# Patient Record
Sex: Female | Born: 2000 | Race: Black or African American | Hispanic: No | Marital: Single | Smoking: Never smoker
Health system: Southern US, Community
[De-identification: ages and names within clinical notes are randomized; demographics above are authoritative.]

## PROBLEM LIST (undated history)

## (undated) ENCOUNTER — Inpatient Hospital Stay: Payer: Self-pay

## (undated) DIAGNOSIS — Z91410 Personal history of adult physical and sexual abuse: Secondary | ICD-10-CM

## (undated) DIAGNOSIS — Z789 Other specified health status: Secondary | ICD-10-CM

## (undated) DIAGNOSIS — I10 Essential (primary) hypertension: Secondary | ICD-10-CM

## (undated) HISTORY — PX: NO PAST SURGERIES: SHX2092

## (undated) HISTORY — DX: Personal history of adult physical and sexual abuse: Z91.410

## (undated) HISTORY — DX: Essential (primary) hypertension: I10

---
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
## 2019-01-16 ENCOUNTER — Encounter (HOSPITAL_COMMUNITY): Payer: Self-pay

## 2019-04-10 DIAGNOSIS — E663 Overweight: Secondary | ICD-10-CM

## 2019-04-10 DIAGNOSIS — I10 Essential (primary) hypertension: Secondary | ICD-10-CM

## 2019-04-13 ENCOUNTER — Ambulatory Visit: Payer: Medicaid Other

## 2019-04-23 ENCOUNTER — Ambulatory Visit: Payer: Medicaid Other

## 2019-05-01 ENCOUNTER — Ambulatory Visit: Payer: Medicaid Other

## 2019-05-08 ENCOUNTER — Other Ambulatory Visit: Payer: Self-pay

## 2019-05-08 ENCOUNTER — Ambulatory Visit: Payer: Medicaid Other | Admitting: Physician Assistant

## 2019-05-08 ENCOUNTER — Encounter: Payer: Self-pay | Admitting: Physician Assistant

## 2019-05-08 DIAGNOSIS — B9689 Other specified bacterial agents as the cause of diseases classified elsewhere: Secondary | ICD-10-CM

## 2019-05-08 DIAGNOSIS — N760 Acute vaginitis: Secondary | ICD-10-CM

## 2019-05-08 DIAGNOSIS — Z113 Encounter for screening for infections with a predominantly sexual mode of transmission: Secondary | ICD-10-CM | POA: Diagnosis not present

## 2019-05-08 MED ORDER — METRONIDAZOLE 500 MG PO TABS: 500.0000 mg | ORAL_TABLET | Freq: Two times a day (BID) | ORAL | 0 refills | Status: DC

## 2019-05-08 NOTE — Progress Notes (Signed)
STI clinic/screening visit  Subjective:  Brittany Rasmussen is a 18 y.o. female being seen today for an STI screening visit. The patient reports they do have symptoms.  Patient has the following medical conditions:   Patient Active Problem List   Diagnosis Date Noted  . Chronic hypertension 12/09/2018  . Overweight 12/09/2018     Chief Complaint  Patient presents with  . SEXUALLY TRANSMITTED DISEASE    HPI  Patient reports that she has had vaginal irritation and odor for 2 weeks.  Reports that she last had sex 6 months or more ago.  LMP 05/01/2019 and normal.  Declines blood work and GC/Chlamydia testing and requests wet mount only today.   See flowsheet for further details and programmatic requirements.    The following portions of the patient's history were reviewed and updated as appropriate: allergies, current medications, past medical history, past social history, past surgical history and problem list.  Objective:  There were no vitals filed for this visit.  Physical Exam Constitutional:      General: She is not in acute distress.    Appearance: Normal appearance.  HENT:     Head: Normocephalic and atraumatic.     Mouth/Throat:     Mouth: Mucous membranes are moist.     Pharynx: Oropharynx is clear. No oropharyngeal exudate.  Eyes:     Conjunctiva/sclera: Conjunctivae normal.  Neck:     Musculoskeletal: Neck supple.  Pulmonary:     Effort: Pulmonary effort is normal.  Abdominal:     Palpations: Abdomen is soft. There is no mass.     Tenderness: There is no abdominal tenderness. There is no guarding or rebound.  Genitourinary:    General: Normal vulva.     Rectum: Normal.     Comments: External genitalia/pubic area without nits, lice, edema, erythema, lesions and inguinal adenopathy. Vagina with normal mucosa and small amount of thin, grayish, adherent discharge, pH= >4.5. Cervix without visible lesions. Uterus normal size, firm, mobile, nt, no masses, no  CMT, no adnexal tenderness.  Lymphadenopathy:     Cervical: No cervical adenopathy.  Skin:    General: Skin is warm.     Findings: No bruising, erythema, lesion or rash.  Neurological:     Mental Status: She is alert and oriented to person, place, and time.  Psychiatric:        Mood and Affect: Mood normal.        Behavior: Behavior normal.        Thought Content: Thought content normal.        Judgment: Judgment normal.       Assessment and Plan:  Brittany Rasmussen is a 18 y.o. female presenting to the [facility] Department for STI screening  1. Screening for STD (sexually transmitted disease) Patient with symptoms for 2 weeks. Rec condoms with all sex - WET PREP FOR TRICH, YEAST, CLUE  2. BV (bacterial vaginosis) Will treat for BV due to s/s and wet mount results. Metronidazole 500mg  #14 1 po BID for 7 days with food, no EtOH for 24 hr before and until 72 hr after completing medicine. Rec to use OTC antifungal cream if itching or increased discharge during or just after treatment with antibiotic.  - metroNIDAZOLE (FLAGYL) 500 MG tablet; Take 1 tablet (500 mg total) by mouth 2 (two) times daily.  Dispense: 14 tablet; Refill: 0     No follow-ups on file.  No future appointments.  Jerene Dilling, PA

## 2019-05-11 LAB — WET PREP FOR TRICH, YEAST, CLUE
Trichomonas Exam: NEGATIVE
Yeast Exam: NEGATIVE

## 2019-09-11 NOTE — L&D Delivery Note (Signed)
Delivery Note At 12:37 PM a viable female was delivered via Vaginal, Spontaneous (Presentation:   Occiput Anterior).  APGAR: 8, 9; weight 7 lb 8.3 oz (3410 g).   Placenta status: Spontaneous, Intact.  Cord: 3 vessels with the following complications: None.  Cord pH: NA  Anesthesia: Epidural Episiotomy: None Lacerations: None Suture Repair: 3.0 Est. Blood Loss (mL): 50  Mom to postpartum.  Baby to Couplet care / Skin to Skin.  Charlesetta Garibaldi Traci Plemons 09/09/2020, 2:27 PM

## 2019-09-25 ENCOUNTER — Other Ambulatory Visit: Payer: Medicaid Other

## 2020-01-21 ENCOUNTER — Encounter (HOSPITAL_COMMUNITY): Payer: Self-pay | Admitting: Pediatrics

## 2020-01-21 ENCOUNTER — Emergency Department (HOSPITAL_COMMUNITY): Payer: Medicaid Other

## 2020-01-21 ENCOUNTER — Other Ambulatory Visit: Payer: Self-pay

## 2020-01-21 ENCOUNTER — Emergency Department (HOSPITAL_COMMUNITY)
Admission: EM | Admit: 2020-01-21 | Discharge: 2020-01-21 | Disposition: A | Discharge status: Home / Self Care | Payer: Medicaid Other | Attending: Emergency Medicine | Admitting: Emergency Medicine

## 2020-01-21 DIAGNOSIS — F17200 Nicotine dependence, unspecified, uncomplicated: Secondary | ICD-10-CM | POA: Diagnosis not present

## 2020-01-21 DIAGNOSIS — I10 Essential (primary) hypertension: Secondary | ICD-10-CM | POA: Insufficient documentation

## 2020-01-21 DIAGNOSIS — Z20822 Contact with and (suspected) exposure to covid-19: Secondary | ICD-10-CM | POA: Insufficient documentation

## 2020-01-21 DIAGNOSIS — J029 Acute pharyngitis, unspecified: Secondary | ICD-10-CM | POA: Insufficient documentation

## 2020-01-21 DIAGNOSIS — J039 Acute tonsillitis, unspecified: Secondary | ICD-10-CM | POA: Insufficient documentation

## 2020-01-21 LAB — CBC WITH DIFFERENTIAL/PLATELET
Abs Immature Granulocytes: 0.1 10*3/uL — ABNORMAL HIGH (ref 0.00–0.07)
Basophils Absolute: 0 10*3/uL (ref 0.0–0.1)
Basophils Relative: 0 %
Eosinophils Absolute: 0.1 10*3/uL (ref 0.0–0.5)
Eosinophils Relative: 0 %
HCT: 35 % — ABNORMAL LOW (ref 36.0–46.0)
Hemoglobin: 11 g/dL — ABNORMAL LOW (ref 12.0–15.0)
Immature Granulocytes: 1 %
Lymphocytes Relative: 6 %
Lymphs Abs: 1 10*3/uL (ref 0.7–4.0)
MCH: 27.2 pg (ref 26.0–34.0)
MCHC: 31.4 g/dL (ref 30.0–36.0)
MCV: 86.6 fL (ref 80.0–100.0)
Monocytes Absolute: 0.8 10*3/uL (ref 0.1–1.0)
Monocytes Relative: 5 %
Neutro Abs: 15.1 10*3/uL — ABNORMAL HIGH (ref 1.7–7.7)
Neutrophils Relative %: 88 %
Platelets: 227 10*3/uL (ref 150–400)
RBC: 4.04 MIL/uL (ref 3.87–5.11)
RDW: 13.8 % (ref 11.5–15.5)
WBC: 17.1 10*3/uL — ABNORMAL HIGH (ref 4.0–10.5)
nRBC: 0 % (ref 0.0–0.2)

## 2020-01-21 LAB — URINALYSIS, ROUTINE W REFLEX MICROSCOPIC
Bilirubin Urine: NEGATIVE
Glucose, UA: NEGATIVE mg/dL
Hgb urine dipstick: NEGATIVE
Ketones, ur: 5 mg/dL — AB
Nitrite: NEGATIVE
Protein, ur: NEGATIVE mg/dL
Specific Gravity, Urine: 1.005 (ref 1.005–1.030)
pH: 6 (ref 5.0–8.0)

## 2020-01-21 LAB — COMPREHENSIVE METABOLIC PANEL
ALT: 14 U/L (ref 0–44)
AST: 16 U/L (ref 15–41)
Albumin: 3.7 g/dL (ref 3.5–5.0)
Alkaline Phosphatase: 68 U/L (ref 38–126)
Anion gap: 10 (ref 5–15)
BUN: 6 mg/dL (ref 6–20)
CO2: 20 mmol/L — ABNORMAL LOW (ref 22–32)
Calcium: 8.8 mg/dL — ABNORMAL LOW (ref 8.9–10.3)
Chloride: 102 mmol/L (ref 98–111)
Creatinine, Ser: 0.66 mg/dL (ref 0.44–1.00)
GFR calc Af Amer: >60 mL/min (ref >60)
GFR calc non Af Amer: >60 mL/min (ref >60)
Glucose, Bld: 96 mg/dL (ref 70–99)
Potassium: 3.9 mmol/L (ref 3.5–5.1)
Sodium: 132 mmol/L — ABNORMAL LOW (ref 135–145)
Total Bilirubin: 1.1 mg/dL (ref 0.3–1.2)
Total Protein: 7.8 g/dL (ref 6.5–8.1)

## 2020-01-21 LAB — LACTIC ACID, PLASMA: Lactic Acid, Venous: 1.1 mmol/L (ref 0.5–1.9)

## 2020-01-21 LAB — SARS CORONAVIRUS 2 BY RT PCR (HOSPITAL ORDER, PERFORMED IN ~~LOC~~ HOSPITAL LAB): SARS Coronavirus 2: NEGATIVE

## 2020-01-21 LAB — I-STAT BETA HCG BLOOD, ED (MC, WL, AP ONLY): I-stat hCG, quantitative: >2000 m[IU]/mL — ABNORMAL HIGH (ref <5)

## 2020-01-21 LAB — GROUP A STREP BY PCR: Group A Strep by PCR: NOT DETECTED

## 2020-01-21 MED ORDER — ACETAMINOPHEN 325 MG PO TABS
650.0000 mg | ORAL_TABLET | Freq: Once | ORAL | Status: AC
  Administered 2020-01-21: 650 mg via ORAL
  Filled 2020-01-21: qty 2

## 2020-01-21 MED ORDER — IOHEXOL 300 MG/ML  SOLN
75.0000 mL | Freq: Once | INTRAMUSCULAR | Status: AC | PRN
  Administered 2020-01-21: 75 mL via INTRAVENOUS

## 2020-01-21 MED ORDER — SODIUM CHLORIDE 0.9% FLUSH: 3.0000 mL | Freq: Once | INTRAVENOUS | Status: DC

## 2020-01-21 MED ORDER — LACTATED RINGERS IV BOLUS
1000.0000 mL | Freq: Once | INTRAVENOUS | Status: AC
  Administered 2020-01-21: 1000 mL via INTRAVENOUS

## 2020-01-21 MED ORDER — AMOXICILLIN 500 MG PO CAPS
500.0000 mg | ORAL_CAPSULE | Freq: Two times a day (BID) | ORAL | 0 refills | Status: AC
Start: 2020-01-21 — End: 2020-01-31

## 2020-01-21 NOTE — ED Notes (Signed)
Went in to draw blood after lab called to say another CBC is needed. Pt refusing blood draw at this time said she needs some time.

## 2020-01-21 NOTE — Discharge Instructions (Signed)
Take antibiotics as prescribed.  Take Tylenol for pain relief and/or fevers.  If your symptoms worsen, despite antibiotics, return to ED.  Schedule a visit with your PCP/OB doctor regarding your pregnancy.
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
## 2020-01-21 NOTE — ED Triage Notes (Signed)
C/O stiff neck x 2 days; patient is febrile in triage.

## 2020-01-26 LAB — CULTURE, BLOOD (ROUTINE X 2)
Culture: NO GROWTH
Culture: NO GROWTH

## 2020-04-28 ENCOUNTER — Encounter (HOSPITAL_COMMUNITY): Payer: Self-pay | Admitting: Obstetrics and Gynecology

## 2020-04-28 ENCOUNTER — Other Ambulatory Visit: Payer: Self-pay

## 2020-04-28 ENCOUNTER — Inpatient Hospital Stay (HOSPITAL_BASED_OUTPATIENT_CLINIC_OR_DEPARTMENT_OTHER): Payer: Medicaid Other

## 2020-04-28 ENCOUNTER — Inpatient Hospital Stay (HOSPITAL_COMMUNITY)
Admission: AD | Admit: 2020-04-28 | Discharge: 2020-04-28 | Disposition: A | Discharge status: Home / Self Care | Payer: Medicaid Other | Attending: Obstetrics and Gynecology | Admitting: Obstetrics and Gynecology

## 2020-04-28 DIAGNOSIS — F17290 Nicotine dependence, other tobacco product, uncomplicated: Secondary | ICD-10-CM | POA: Insufficient documentation

## 2020-04-28 DIAGNOSIS — Z3686 Encounter for antenatal screening for cervical length: Secondary | ICD-10-CM | POA: Diagnosis not present

## 2020-04-28 DIAGNOSIS — R109 Unspecified abdominal pain: Secondary | ICD-10-CM | POA: Diagnosis present

## 2020-04-28 DIAGNOSIS — O26892 Other specified pregnancy related conditions, second trimester: Secondary | ICD-10-CM

## 2020-04-28 DIAGNOSIS — B9689 Other specified bacterial agents as the cause of diseases classified elsewhere: Secondary | ICD-10-CM | POA: Insufficient documentation

## 2020-04-28 DIAGNOSIS — N760 Acute vaginitis: Secondary | ICD-10-CM | POA: Diagnosis not present

## 2020-04-28 DIAGNOSIS — O23592 Infection of other part of genital tract in pregnancy, second trimester: Secondary | ICD-10-CM | POA: Diagnosis not present

## 2020-04-28 DIAGNOSIS — O0932 Supervision of pregnancy with insufficient antenatal care, second trimester: Secondary | ICD-10-CM | POA: Diagnosis not present

## 2020-04-28 DIAGNOSIS — O99332 Smoking (tobacco) complicating pregnancy, second trimester: Secondary | ICD-10-CM | POA: Diagnosis not present

## 2020-04-28 DIAGNOSIS — R1030 Lower abdominal pain, unspecified: Secondary | ICD-10-CM

## 2020-04-28 DIAGNOSIS — Z3A19 19 weeks gestation of pregnancy: Secondary | ICD-10-CM

## 2020-04-28 DIAGNOSIS — O26899 Other specified pregnancy related conditions, unspecified trimester: Secondary | ICD-10-CM

## 2020-04-28 LAB — WET PREP, GENITAL
Sperm: NONE SEEN
Trich, Wet Prep: NONE SEEN
Yeast Wet Prep HPF POC: NONE SEEN

## 2020-04-28 LAB — CBC
HCT: 35.7 % — ABNORMAL LOW (ref 36.0–46.0)
Hemoglobin: 11.5 g/dL — ABNORMAL LOW (ref 12.0–15.0)
MCH: 26.7 pg (ref 26.0–34.0)
MCHC: 32.2 g/dL (ref 30.0–36.0)
MCV: 83 fL (ref 80.0–100.0)
Platelets: 343 10*3/uL (ref 150–400)
RBC: 4.3 MIL/uL (ref 3.87–5.11)
RDW: 14.1 % (ref 11.5–15.5)
WBC: 11.7 10*3/uL — ABNORMAL HIGH (ref 4.0–10.5)
nRBC: 0 % (ref 0.0–0.2)

## 2020-04-28 LAB — URINALYSIS, ROUTINE W REFLEX MICROSCOPIC
Bilirubin Urine: NEGATIVE
Glucose, UA: NEGATIVE mg/dL
Hgb urine dipstick: NEGATIVE
Ketones, ur: NEGATIVE mg/dL
Nitrite: NEGATIVE
Protein, ur: 30 mg/dL — AB
Specific Gravity, Urine: 1.032 — ABNORMAL HIGH (ref 1.005–1.030)
Squamous Epithelial / HPF: >50 — ABNORMAL HIGH (ref 0–5)
pH: 5 (ref 5.0–8.0)

## 2020-04-28 LAB — COMPREHENSIVE METABOLIC PANEL
ALT: 10 U/L (ref 0–44)
AST: 12 U/L — ABNORMAL LOW (ref 15–41)
Albumin: 3 g/dL — ABNORMAL LOW (ref 3.5–5.0)
Alkaline Phosphatase: 56 U/L (ref 38–126)
Anion gap: 8 (ref 5–15)
BUN: 6 mg/dL (ref 6–20)
CO2: 23 mmol/L (ref 22–32)
Calcium: 9.4 mg/dL (ref 8.9–10.3)
Chloride: 105 mmol/L (ref 98–111)
Creatinine, Ser: 0.52 mg/dL (ref 0.44–1.00)
GFR calc Af Amer: >60 mL/min (ref >60)
GFR calc non Af Amer: >60 mL/min (ref >60)
Glucose, Bld: 82 mg/dL (ref 70–99)
Potassium: 3.8 mmol/L (ref 3.5–5.1)
Sodium: 136 mmol/L (ref 135–145)
Total Bilirubin: 0.2 mg/dL — ABNORMAL LOW (ref 0.3–1.2)
Total Protein: 6.4 g/dL — ABNORMAL LOW (ref 6.5–8.1)

## 2020-04-28 LAB — PROTEIN / CREATININE RATIO, URINE
Creatinine, Urine: 347.56 mg/dL
Protein Creatinine Ratio: 0.08 mg/mg{Cre} (ref 0.00–0.15)
Total Protein, Urine: 27 mg/dL

## 2020-04-28 LAB — POCT PREGNANCY, URINE: Preg Test, Ur: POSITIVE — AB

## 2020-04-28 MED ORDER — METRONIDAZOLE 500 MG PO TABS: 500.0000 mg | ORAL_TABLET | Freq: Two times a day (BID) | ORAL | 0 refills | Status: AC
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
## 2020-04-28 NOTE — MAU Note (Signed)
.   Brittany Rasmussen is a 19 y.o. at [redacted]w[redacted]d here in MAU reporting: she has been having lower abdominal for a few days. Denies any VB or LOF. Pt has not had prenatal care yet. LMP: 12/13/19  Onset of complaint: couple of days Pain score: 6 Vitals:   04/28/20 1818  BP: (!) 151/87  Pulse: 92  Resp: 16  Temp: 98.2 F (36.8 C)  SpO2: 100%     FHT:152 Lab orders placed from triage: UA/UPT

## 2020-04-28 NOTE — Discharge Instructions (Signed)
Safe Medications in Pregnancy    Acne: Benzoyl Peroxide Salicylic Acid  Backache/Headache: Tylenol: 2 regular strength every 4 hours OR              2 Extra strength every 6 hours  Colds/Coughs/Allergies: Benadryl (alcohol free) 25 mg every 6 hours as needed Breath right strips Claritin Cepacol throat lozenges Chloraseptic throat spray Cold-Eeze- up to three times per day Cough drops, alcohol free Flonase (by prescription only) Guaifenesin Mucinex Robitussin DM (plain only, alcohol free) Saline nasal spray/drops Sudafed (pseudoephedrine) & Actifed ** use only after [redacted] weeks gestation and if you do not have high blood pressure Tylenol Vicks Vaporub Zinc lozenges Zyrtec   Constipation: Colace Ducolax suppositories Fleet enema Glycerin suppositories Metamucil Milk of magnesia Miralax Senokot Smooth move tea  Diarrhea: Kaopectate Imodium A-D  *NO pepto Bismol  Hemorrhoids: Anusol Anusol HC Preparation H Tucks  Indigestion: Tums Maalox Mylanta Zantac  Pepcid  Insomnia: Benadryl (alcohol free) 25mg  every 6 hours as needed Tylenol PM Unisom, no Gelcaps  Leg Cramps: Tums MagGel  Nausea/Vomiting:  Bonine Dramamine Emetrol Ginger extract Sea bands Meclizine  Nausea medication to take during pregnancy:  Unisom (doxylamine succinate 25 mg tablets) Take one tablet daily at bedtime. If symptoms are not adequately controlled, the dose can be increased to a maximum recommended dose of two tablets daily (1/2 tablet in the morning, 1/2 tablet mid-afternoon and one at bedtime). Vitamin B6 100mg  tablets. Take one tablet twice a day (up to 200 mg per day).  Skin Rashes: Aveeno products Benadryl cream or 25mg  every 6 hours as needed Calamine Lotion 1% cortisone cream  Yeast infection: Gyne-lotrimin 7 Monistat 7   **If taking multiple medications, please check labels to avoid duplicating the same active ingredients **take  medication as directed on the label ** Do not exceed 4000 mg of tylenol in 24 hours **Do not take medications that contain aspirin or ibuprofen          Prenatal Care Providers           [facility] @ [facility] - accepts patients without insurance  Phone: [phone]  Center for @ [facility]   Phone: [phone]  [facility]       Phone: [phone]            [facility] @ [place]     Phone: [phone]          Center for [id number] @ [facility]   Phone: [phone]  [facility] @ [facility] - accepts patients without insurance  Phone: [phone]  [facility] @ [facility] Phone: [phone]     [facility] - accepts patients without insurance Phone: [phone]  [facility]  Phone: [phone]  [facility] Phone: [phone]  [facility] Phone: [phone]  [facility] Phone: [phone]  [facility] Phone: [phone]  [facility]  Phone: [phone]          Abdominal Pain During Pregnancy  Abdominal pain is common during pregnancy, and has many possible causes. Some causes are more serious than others, and sometimes the cause is not known. Abdominal pain can be a sign that labor is starting. It can also be caused by normal growth and stretching of muscles and ligaments during pregnancy. Always tell your health care provider if you have any abdominal pain. Follow these instructions at home:  Do not  have sex or put anything in your vagina until your pain goes away completely.  Get plenty of rest until your pain improves.  Drink enough fluid to keep your urine pale yellow.  Take over-the-counter and prescription medicines only as told by your health care provider.  Keep all follow-up visits as told by your  health care provider. This is important. Contact a health care provider if:  Your pain continues or gets worse after resting.  You have lower abdominal pain that: ? Comes and goes at regular intervals. ? Spreads to your back. ? Is similar to menstrual cramps.  You have pain or burning when you urinate. Get help right away if:  You have a fever or chills.  You have vaginal bleeding.  You are leaking fluid from your vagina.  You are passing tissue from your vagina.  You have vomiting or diarrhea that lasts for more than 24 hours.  Your baby is moving less than usual.  You feel very weak or faint.  You have shortness of breath.  You develop severe pain in your upper abdomen. Summary  Abdominal pain is common during pregnancy, and has many possible causes.  If you experience abdominal pain during pregnancy, tell your health care provider right away.  Follow your health care provider's home care instructions and keep all follow-up visits as directed. This information is not intended to replace advice given to you by your health care provider. Make sure you discuss any questions you have with your health care provider. Document Revised: 12/15/2018 Document Reviewed: 11/29/2016 Elsevier Patient Education  2020 [employer]        Second Trimester of Pregnancy The second trimester is from week 14 through week 27 (months 4 through 6). The second trimester is often a time when you feel your best. Your body has adjusted to being pregnant, and you begin to feel better physically. Usually, morning sickness has lessened or quit completely, you may have more energy, and you may have an increase in appetite. The second trimester is also a time when the fetus is growing rapidly. At the end of the sixth month, the fetus is about 9 inches long and weighs about 1 pounds. You will likely begin to feel the baby move (quickening) between 16 and 20 weeks of pregnancy. Body changes during  your second trimester Your body continues to go through many changes during your second trimester. The changes vary from woman to woman.  Your weight will continue to increase. You will notice your lower abdomen bulging out.  You may begin to get stretch marks on your hips, abdomen, and breasts.  You may develop headaches that can be relieved by medicines. The medicines should be approved by your health care provider.  You may urinate more often because the fetus is pressing on your bladder.  You may develop or continue to have heartburn as a result of your pregnancy.  You may develop constipation because certain hormones are causing the muscles that push waste through your intestines to slow down.  You may develop hemorrhoids or swollen, bulging veins (varicose veins).  You may have back pain. This is caused by: ? Weight gain. ? Pregnancy hormones that are relaxing the joints in your pelvis. ? A shift in weight and the muscles that support your balance.  Your breasts will continue to grow and they will continue to become tender.  Your gums may bleed and may be sensitive to brushing and flossing.  Dark spots or blotches (  chloasma, mask of pregnancy) may develop on your face. This will likely fade after the baby is born.  A dark line from your belly button to the pubic area (linea nigra) may appear. This will likely fade after the baby is born.  You may have changes in your hair. These can include thickening of your hair, rapid growth, and changes in texture. Some women also have hair loss during or after pregnancy, or hair that feels dry or thin. Your hair will most likely return to normal after your baby is born. What to expect at prenatal visits During a routine prenatal visit:  You will be weighed to make sure you and the fetus are growing normally.  Your blood pressure will be taken.  Your abdomen will be measured to track your baby's growth.  The fetal heartbeat will be  listened to.  Any test results from the previous visit will be discussed. Your health care provider may ask you:  How you are feeling.  If you are feeling the baby move.  If you have had any abnormal symptoms, such as leaking fluid, bleeding, severe headaches, or abdominal cramping.  If you are using any tobacco products, including cigarettes, chewing tobacco, and electronic cigarettes.  If you have any questions. Other tests that may be performed during your second trimester include:  Blood tests that check for: ? Low iron levels (anemia). ? High blood sugar that affects pregnant women (gestational diabetes) between 75 and 28 weeks. ? Rh antibodies. This is to check for a protein on red blood cells (Rh factor).  Urine tests to check for infections, diabetes, or protein in the urine.  An ultrasound to confirm the proper growth and development of the baby.  An amniocentesis to check for possible genetic problems.  Fetal screens for spina bifida and Down syndrome.  HIV (human immunodeficiency virus) testing. Routine prenatal testing includes screening for HIV, unless you choose not to have this test. Follow these instructions at home: Medicines  Follow your health care provider's instructions regarding medicine use. Specific medicines may be either safe or unsafe to take during pregnancy.  Take a prenatal vitamin that contains at least 600 micrograms (mcg) of folic acid.  If you develop constipation, try taking a stool softener if your health care provider approves. Eating and drinking   Eat a balanced diet that includes fresh fruits and vegetables, whole grains, good sources of protein such as meat, eggs, or tofu, and low-fat dairy. Your health care provider will help you determine the amount of weight gain that is right for you.  Avoid raw meat and uncooked cheese. These carry germs that can cause birth defects in the baby.  If you have low calcium intake from food, talk  to your health care provider about whether you should take a daily calcium supplement.  Limit foods that are high in fat and processed sugars, such as fried and sweet foods.  To prevent constipation: ? Drink enough fluid to keep your urine clear or pale yellow. ? Eat foods that are high in fiber, such as fresh fruits and vegetables, whole grains, and beans. Activity  Exercise only as directed by your health care provider. Most women can continue their usual exercise routine during pregnancy. Try to exercise for 30 minutes at least 5 days a week. Stop exercising if you experience uterine contractions.  Avoid heavy lifting, wear low heel shoes, and practice good posture.  A sexual relationship may be continued unless your health care provider  directs you otherwise. Relieving pain and discomfort  Wear a good support bra to prevent discomfort from breast tenderness.  Take warm sitz baths to soothe any pain or discomfort caused by hemorrhoids. Use hemorrhoid cream if your health care provider approves.  Rest with your legs elevated if you have leg cramps or low back pain.  If you develop varicose veins, wear support hose. Elevate your feet for 15 minutes, 3-4 times a day. Limit salt in your diet. Prenatal Care  Write down your questions. Take them to your prenatal visits.  Keep all your prenatal visits as told by your health care provider. This is important. Safety  Wear your seat belt at all times when driving.  Make a list of emergency phone numbers, including numbers for family, friends, the hospital, and police and fire departments. General instructions  Ask your health care provider for a referral to a local prenatal education class. Begin classes no later than the beginning of month 6 of your pregnancy.  Ask for help if you have counseling or nutritional needs during pregnancy. Your health care provider can offer advice or refer you to specialists for help with various  needs.  Do not use hot tubs, steam rooms, or saunas.  Do not douche or use tampons or scented sanitary pads.  Do not cross your legs for long periods of time.  Avoid cat litter boxes and soil used by cats. These carry germs that can cause birth defects in the baby and possibly loss of the fetus by miscarriage or stillbirth.  Avoid all smoking, herbs, alcohol, and unprescribed drugs. Chemicals in these products can affect the formation and growth of the baby.  Do not use any products that contain nicotine or tobacco, such as cigarettes and e-cigarettes. If you need help quitting, ask your health care provider.  Visit your dentist if you have not gone yet during your pregnancy. Use a soft toothbrush to brush your teeth and be gentle when you floss. Contact a health care provider if:  You have dizziness.  You have mild pelvic cramps, pelvic pressure, or nagging pain in the abdominal area.  You have persistent nausea, vomiting, or diarrhea.  You have a bad smelling vaginal discharge.  You have pain when you urinate. Get help right away if:  You have a fever.  You are leaking fluid from your vagina.  You have spotting or bleeding from your vagina.  You have severe abdominal cramping or pain.  You have rapid weight gain or weight loss.  You have shortness of breath with chest pain.  You notice sudden or extreme swelling of your face, hands, ankles, feet, or legs.  You have not felt your baby move in over an hour.  You have severe headaches that do not go away when you take medicine.  You have vision changes. Summary  The second trimester is from week 14 through week 27 (months 4 through 6). It is also a time when the fetus is growing rapidly.  Your body goes through many changes during pregnancy. The changes vary from woman to woman.  Avoid all smoking, herbs, alcohol, and unprescribed drugs. These chemicals affect the formation and growth your baby.  Do not use any  tobacco products, such as cigarettes, chewing tobacco, and e-cigarettes. If you need help quitting, ask your health care provider.  Contact your health care provider if you have any questions. Keep all prenatal visits as told by your health care provider. This is important. This information  is not intended to replace advice given to you by your health care provider. Make sure you discuss any questions you have with your health care provider. Document Revised: 12/19/2018 Document Reviewed: 10/02/2016 Elsevier Patient Education  2020 Elsevier Inc.        Round Ligament Pain  The round ligament is a cord of muscle and tissue that helps support the uterus. It can become a source of pain during pregnancy if it becomes stretched or twisted as the baby grows. The pain usually begins in the second trimester (13-28 weeks) of pregnancy, and it can come and go until the baby is delivered. It is not a serious problem, and it does not cause harm to the baby. Round ligament pain is usually a short, sharp, and pinching pain, but it can also be a dull, lingering, and aching pain. The pain is felt in the lower side of the abdomen or in the groin. It usually starts deep in the groin and moves up to the outside of the hip area. The pain may occur when you:  Suddenly change position, such as quickly going from a sitting to standing position.  Roll over in bed.  Cough or sneeze.  Do physical activity. Follow these instructions at home:   Watch your condition for any changes.  When the pain starts, relax. Then try any of these methods to help with the pain: ? Sitting down. ? Flexing your knees up to your abdomen. ? Lying on your side with one pillow under your abdomen and another pillow between your legs. ? Sitting in a warm bath for 15-20 minutes or until the pain goes away.  Take over-the-counter and prescription medicines only as told by your health care provider.  Move slowly when you sit down  or stand up.  Avoid long walks if they cause pain.  Stop or reduce your physical activities if they cause pain.  Keep all follow-up visits as told by your health care provider. This is important. Contact a health care provider if:  Your pain does not go away with treatment.  You feel pain in your back that you did not have before.  Your medicine is not helping. Get help right away if:  You have a fever or chills.  You develop uterine contractions.  You have vaginal bleeding.  You have nausea or vomiting.  You have diarrhea.  You have pain when you urinate. Summary  Round ligament pain is felt in the lower abdomen or groin. It is usually a short, sharp, and pinching pain. It can also be a dull, lingering, and aching pain.  This pain usually begins in the second trimester (13-28 weeks). It occurs because the uterus is stretching with the growing baby, and it is not harmful to the baby.  You may notice the pain when you suddenly change position, when you cough or sneeze, or during physical activity.  Relaxing, flexing your knees to your abdomen, lying on one side, or taking a warm bath may help to get rid of the pain.  Get help from your health care provider if the pain does not go away or if you have vaginal bleeding, nausea, vomiting, diarrhea, or painful urination. This information is not intended to replace advice given to you by your health care provider. Make sure you discuss any questions you have with your health care provider. Document Revised: 02/12/2018 Document Reviewed: 02/12/2018 Elsevier Patient Education  2020 [employer].

## 2020-04-29 ENCOUNTER — Telehealth: Payer: Self-pay | Admitting: Family Medicine

## 2020-04-29 LAB — GC/CHLAMYDIA PROBE AMP (~~LOC~~) NOT AT ARMC
Chlamydia: NEGATIVE
Comment: NEGATIVE
Comment: NORMAL
Neisseria Gonorrhea: NEGATIVE

## 2020-04-29 LAB — ABO/RH: ABO/RH(D): B POS

## 2020-04-29 NOTE — Telephone Encounter (Signed)
Attempted to contact patient to get her scheduled for a bp check and new ob appointment, unable to reach patient, message left with step mother to have patient call our office back to be scheduled for appointments.

## 2020-04-30 LAB — CULTURE, OB URINE

## 2020-05-11 ENCOUNTER — Telehealth (INDEPENDENT_AMBULATORY_CARE_PROVIDER_SITE_OTHER): Payer: Medicaid Other | Admitting: *Deleted

## 2020-05-11 ENCOUNTER — Other Ambulatory Visit: Payer: Self-pay

## 2020-05-11 DIAGNOSIS — O219 Vomiting of pregnancy, unspecified: Secondary | ICD-10-CM

## 2020-05-11 DIAGNOSIS — I10 Essential (primary) hypertension: Secondary | ICD-10-CM

## 2020-05-11 DIAGNOSIS — O0990 Supervision of high risk pregnancy, unspecified, unspecified trimester: Secondary | ICD-10-CM

## 2020-05-11 MED ORDER — PROMETHAZINE HCL 25 MG PO TABS: 25.0000 mg | ORAL_TABLET | Freq: Four times a day (QID) | ORAL | 0 refills | Status: DC | PRN

## 2020-05-11 MED ORDER — PRENATAL VITAMINS 28-0.8 MG PO TABS: 1.0000 | ORAL_TABLET | Freq: Every day | ORAL | 11 refills | Status: AC

## 2020-05-11 NOTE — Patient Instructions (Signed)
  [facility], we work as an integrated team, providing care to address both physical and emotional health. Your medical provider may refer you to see our Behavioral Health Clinician (BHC) on the same day you see your medical provider, as availability permits.  Our BHC is available to all patients, visits generally last between 20-30 minutes, but can be longer or shorter, depending on patient need. The BHC offers help with stress management, coping with symptoms of depression and anxiety, major life changes , sleep issues, changing risky behavior, grief and loss, life stress, working on personal life goals, and  behavioral health issues, as these all affect your overall health and wellness.  The BHC is NOT available for the following: FMLA paperwork, court-ordered evaluations, specialty assessments (custody or disability), letters to employers, or obtaining certification for an emotional support animal. The BHC does not provide long-term therapy. You have the right to refuse integrated behavioral health services, or to reschedule to see the BHC at a later date.  Exception: If you are having thoughts of suicide, we require that you either see the BHC for further assessment, or contract for safety with your medical provider. Confidentiality exception: If it is suspected that a child or disabled adult is being abused or neglected, we are required by law to report that to either Child Protective Services or Adult Protective Services.  If you have a diagnosis of Bipolar affective disorder, Schizophrenia, or recurrent Major depressive disorder, we will recommend that you establish care with a psychiatrist, as these are lifelong, chronic conditions, and we want your overall emotional health and medications to be more closely monitored. If you anticipate needing extended maternity leave due to mental illness, it it recommended you inform your medical provider, so  we can put in a referral to a  psychiatrist as soon as possible. The BHC is unable to recommend an extended maternity leave for mental health issues. Your medical provider or BHC may refer you to a therapist for ongoing, traditional therapy, or to a psychiatrist, for medication management, if it would benefit your overall health. Depending on your insurance, you may have a copay to see the BHC. If you are uninsured, it is recommended that you apply for financial assistance. (Forms may be requested at the front desk for in-person visits, via MyChart, or request a form during a virtual visit).  If you see the BHC more than 6 times, you will have to complete a comprehensive clinical assessment interview with the BHC to resume integrated services.  For virtual visits with the BHC, you must be physically in the state of [place] at the time of the visit. For example, if you live in [place], you will have to do an in-person visit with the BHC. If you are going out of the state or country for any reason, the BHC may see you virtually when you return to [place], but not while you are physically outside of .    

## 2020-05-11 NOTE — Progress Notes (Signed)
11:30 [place] not connected virtually for her new ob intake/ I called her and she does not have mychart . I asked if I could send her a text for her to sign up. She states she cannot because her phone is messed up. I informed her we can proceed with telephone  Visit.  I connected with  Angelena Sole on 05/11/20 at 11:15 AM EDT by telephone and verified that I am speaking with the correct person using two identifiers.   I discussed the limitations, risks, security and privacy concerns of performing an evaluation and management service by telephone and the availability of in person appointments. I also discussed with the patient that there may be a patient responsible charge related to this service. The patient expressed understanding and agreed to proceed.  At time of visit patient at home and provider in office at [facility].   I explained I am completing her New OB Intake today. We discussed Her EDD and that it is based on  sure LMP . I reviewed her allergies, meds, OB History, Medical /Surgical history, and appropriate screenings. I informed her of [facility] services.   I ordered PNV per protocol. She c/o nausea and vomiting. Nausea meds per protocol discussed and Phenergan RX ordered per pt. Preference per protocol.  She is G3  P1011.  I explained we will have her take her blood pressure weekly during her pregnancy.  She verified she does have a blood pressure cuff she can use from her sister who was pregnant recently. I explained after her first ob appointment   we will have her take her blood pressure weekly I  explained we will have her log her blood pressures into an app that I will send her called [employer]. The  app was sent to her while on phone.     I explained she will have some visits in office and some virtually if she is able to do them.   I reviewed her new ob  appointment date/ time with her , our location and to wear mask,  I explained she will have a pelvic exam, ob bloodwork, hemoglobin a1C,  cbg , and  genetic testing if desired,- she is undecided  a panorama.  I explained I will scheduled an [place] at 19 weeks and she will see the appointment later in MyChart. She voices understanding.   Montserrat Shek,RN 05/11/2020  11:37 AM

## 2020-05-11 NOTE — Progress Notes (Signed)
Patient was assessed and managed by nursing staff during this encounter. I have reviewed the chart and agree with the documentation and plan. I have also made any necessary editorial changes.  Warden Fillers, MD 05/11/2020 2:31 PM

## 2020-05-20 ENCOUNTER — Ambulatory Visit: Payer: Medicaid Other

## 2020-05-20 ENCOUNTER — Ambulatory Visit: Payer: Medicaid Other | Attending: Obstetrics and Gynecology

## 2020-05-26 ENCOUNTER — Ambulatory Visit (INDEPENDENT_AMBULATORY_CARE_PROVIDER_SITE_OTHER): Payer: Medicaid Other | Admitting: Obstetrics and Gynecology

## 2020-05-26 ENCOUNTER — Other Ambulatory Visit (HOSPITAL_COMMUNITY)
Admission: RE | Admit: 2020-05-26 | Discharge: 2020-05-26 | Disposition: A | Discharge status: Home / Self Care | Payer: Medicaid Other | Source: Ambulatory Visit | Attending: Obstetrics and Gynecology | Admitting: Obstetrics and Gynecology

## 2020-05-26 ENCOUNTER — Other Ambulatory Visit: Payer: Self-pay

## 2020-05-26 VITALS — BP 132/85 | HR 100 | Wt 181.0 lb

## 2020-05-26 DIAGNOSIS — O0990 Supervision of high risk pregnancy, unspecified, unspecified trimester: Secondary | ICD-10-CM | POA: Insufficient documentation

## 2020-05-26 DIAGNOSIS — Z3A23 23 weeks gestation of pregnancy: Secondary | ICD-10-CM | POA: Insufficient documentation

## 2020-05-26 DIAGNOSIS — O0932 Supervision of pregnancy with insufficient antenatal care, second trimester: Secondary | ICD-10-CM

## 2020-05-26 DIAGNOSIS — I10 Essential (primary) hypertension: Secondary | ICD-10-CM

## 2020-05-26 MED ORDER — LABETALOL HCL 100 MG PO TABS: 200.0000 mg | ORAL_TABLET | Freq: Two times a day (BID) | ORAL | 4 refills | Status: DC

## 2020-05-26 MED ORDER — ASPIRIN 81 MG PO CHEW: 81.0000 mg | CHEWABLE_TABLET | Freq: Once | ORAL | Status: DC
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
## 2020-05-26 NOTE — Patient Instructions (Signed)

## 2020-05-27 LAB — CBC/D/PLT+RPR+RH+ABO+RUB AB...
Antibody Screen: NEGATIVE
Basophils Absolute: 0 10*3/uL (ref 0.0–0.2)
Basos: 0 %
EOS (ABSOLUTE): 0 10*3/uL (ref 0.0–0.4)
Eos: 0 %
HCV Ab: 0.1 s/co ratio (ref 0.0–0.9)
HIV Screen 4th Generation wRfx: NONREACTIVE
Hematocrit: 33.7 % — ABNORMAL LOW (ref 34.0–46.6)
Hemoglobin: 11.3 g/dL (ref 11.1–15.9)
Hepatitis B Surface Ag: NEGATIVE
Immature Grans (Abs): 0.1 10*3/uL (ref 0.0–0.1)
Immature Granulocytes: 1 %
Lymphocytes Absolute: 1.6 10*3/uL (ref 0.7–3.1)
Lymphs: 11 %
MCH: 27.4 pg (ref 26.6–33.0)
MCHC: 33.5 g/dL (ref 31.5–35.7)
MCV: 82 fL (ref 79–97)
Monocytes Absolute: 0.8 10*3/uL (ref 0.1–0.9)
Monocytes: 6 %
Neutrophils Absolute: 11.7 10*3/uL — ABNORMAL HIGH (ref 1.4–7.0)
Neutrophils: 82 %
Platelets: 365 10*3/uL (ref 150–450)
RBC: 4.13 x10E6/uL (ref 3.77–5.28)
RDW: 12.7 % (ref 11.7–15.4)
RPR Ser Ql: NONREACTIVE
Rh Factor: POSITIVE
Rubella Antibodies, IGG: 6.36 index (ref >0.99)
WBC: 14.3 10*3/uL — ABNORMAL HIGH (ref 3.4–10.8)

## 2020-05-27 LAB — COMPREHENSIVE METABOLIC PANEL
ALT: 8 IU/L (ref 0–32)
AST: 9 IU/L (ref 0–40)
Albumin/Globulin Ratio: 1.2 (ref 1.2–2.2)
Albumin: 4.2 g/dL (ref 3.9–5.0)
Alkaline Phosphatase: 92 IU/L (ref 42–106)
BUN/Creatinine Ratio: 11 (ref 9–23)
BUN: 6 mg/dL (ref 6–20)
Bilirubin Total: <0.2 mg/dL (ref 0.0–1.2)
CO2: 20 mmol/L (ref 20–29)
Calcium: 9.4 mg/dL (ref 8.7–10.2)
Chloride: 102 mmol/L (ref 96–106)
Creatinine, Ser: 0.54 mg/dL — ABNORMAL LOW (ref 0.57–1.00)
GFR calc Af Amer: 157 mL/min/{1.73_m2} (ref >59)
GFR calc non Af Amer: 136 mL/min/{1.73_m2} (ref >59)
Globulin, Total: 3.4 g/dL (ref 1.5–4.5)
Glucose: 68 mg/dL (ref 65–99)
Potassium: 4.1 mmol/L (ref 3.5–5.2)
Sodium: 135 mmol/L (ref 134–144)
Total Protein: 7.6 g/dL (ref 6.0–8.5)

## 2020-05-27 LAB — GC/CHLAMYDIA PROBE AMP (~~LOC~~) NOT AT ARMC
Chlamydia: NEGATIVE
Comment: NEGATIVE
Comment: NORMAL
Neisseria Gonorrhea: NEGATIVE

## 2020-05-27 LAB — PROTEIN / CREATININE RATIO, URINE
Creatinine, Urine: 296.9 mg/dL
Protein, Ur: 21.5 mg/dL
Protein/Creat Ratio: 72 mg/g creat (ref 0–200)

## 2020-05-27 LAB — HCV INTERPRETATION

## 2020-05-28 LAB — CULTURE, OB URINE

## 2020-05-28 LAB — URINE CULTURE, OB REFLEX

## 2020-06-06 ENCOUNTER — Encounter: Payer: Self-pay | Admitting: *Deleted

## 2020-06-06 ENCOUNTER — Other Ambulatory Visit: Payer: Self-pay

## 2020-06-06 ENCOUNTER — Ambulatory Visit: Payer: Medicaid Other | Admitting: *Deleted

## 2020-06-06 ENCOUNTER — Ambulatory Visit: Payer: Medicaid Other | Attending: Obstetrics and Gynecology

## 2020-06-06 DIAGNOSIS — O0990 Supervision of high risk pregnancy, unspecified, unspecified trimester: Secondary | ICD-10-CM | POA: Insufficient documentation

## 2020-06-07 ENCOUNTER — Other Ambulatory Visit: Payer: Self-pay | Admitting: *Deleted

## 2020-06-07 DIAGNOSIS — O10919 Unspecified pre-existing hypertension complicating pregnancy, unspecified trimester: Secondary | ICD-10-CM

## 2020-06-15 ENCOUNTER — Encounter: Payer: Medicaid Other | Admitting: Obstetrics and Gynecology

## 2020-07-08 ENCOUNTER — Other Ambulatory Visit: Payer: Self-pay

## 2020-07-08 ENCOUNTER — Ambulatory Visit: Payer: Medicaid Other | Attending: Obstetrics and Gynecology

## 2020-07-08 ENCOUNTER — Ambulatory Visit: Payer: Medicaid Other | Admitting: *Deleted

## 2020-07-08 DIAGNOSIS — O0990 Supervision of high risk pregnancy, unspecified, unspecified trimester: Secondary | ICD-10-CM | POA: Insufficient documentation

## 2020-07-08 DIAGNOSIS — O26892 Other specified pregnancy related conditions, second trimester: Secondary | ICD-10-CM | POA: Diagnosis not present

## 2020-07-08 DIAGNOSIS — Z3A29 29 weeks gestation of pregnancy: Secondary | ICD-10-CM

## 2020-07-08 DIAGNOSIS — O10913 Unspecified pre-existing hypertension complicating pregnancy, third trimester: Secondary | ICD-10-CM | POA: Diagnosis not present

## 2020-07-08 DIAGNOSIS — O10919 Unspecified pre-existing hypertension complicating pregnancy, unspecified trimester: Secondary | ICD-10-CM

## 2020-07-08 DIAGNOSIS — O0932 Supervision of pregnancy with insufficient antenatal care, second trimester: Secondary | ICD-10-CM

## 2020-07-08 NOTE — Progress Notes (Signed)
Pt not taking labetalol bid. Pt states she is only taking it at night.  Discussed why she needed to take as directed.  Plans to start taking bid.  BP today in MFM 148/88Rt, 150/82 Lt. Pt denies HA, vision changes, or epigastric pain.  Reports family states her face/nose is swollen.  Good fetal movement.

## 2020-07-11 ENCOUNTER — Other Ambulatory Visit: Payer: Self-pay | Admitting: *Deleted

## 2020-07-11 DIAGNOSIS — O10919 Unspecified pre-existing hypertension complicating pregnancy, unspecified trimester: Secondary | ICD-10-CM

## 2020-07-13 ENCOUNTER — Encounter: Payer: Self-pay | Admitting: Family Medicine

## 2020-07-13 ENCOUNTER — Telehealth: Payer: Self-pay | Admitting: Family Medicine

## 2020-07-13 ENCOUNTER — Encounter: Payer: Medicaid Other | Admitting: Family Medicine

## 2020-07-13 NOTE — Telephone Encounter (Signed)
Spoke with pt to reschedule missed appointment, has not been seen since September. Attempted to schedule for 07-18-20 visit but pt refused appointment and requested a later date. Pt aware of new appointment time.  Pt stated that her BP was high, request call back from clinical staff

## 2020-07-13 NOTE — Progress Notes (Signed)
Patient did not keep appointment today. She will be called to reschedule.  

## 2020-07-14 NOTE — Telephone Encounter (Signed)
Called and reached Brittany Rasmussen mother. She was not at home with Brittany Rasmussen and Brittany Rasmussen does not have a phone. She will give message to patient to call the office this afternoon.

## 2020-07-25 ENCOUNTER — Ambulatory Visit (INDEPENDENT_AMBULATORY_CARE_PROVIDER_SITE_OTHER): Payer: Medicaid Other | Admitting: Obstetrics and Gynecology

## 2020-07-25 ENCOUNTER — Other Ambulatory Visit: Payer: Self-pay

## 2020-07-25 VITALS — BP 143/82 | HR 102 | Wt 191.0 lb

## 2020-07-25 DIAGNOSIS — O0990 Supervision of high risk pregnancy, unspecified, unspecified trimester: Secondary | ICD-10-CM

## 2020-07-25 DIAGNOSIS — I10 Essential (primary) hypertension: Secondary | ICD-10-CM

## 2020-07-25 DIAGNOSIS — Z3A32 32 weeks gestation of pregnancy: Secondary | ICD-10-CM | POA: Insufficient documentation
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
## 2020-07-25 NOTE — Progress Notes (Signed)
Left side soreness arms to feet Vaginal pressure and "feeling like insides are gonna fall out" Trying to figure out when to come to do the GTT

## 2020-07-25 NOTE — Patient Instructions (Addendum)
Hypertension During Pregnancy Hypertension is also called high blood pressure. High blood pressure means that the force of your blood moving in your body is too strong. It can cause problems for you and your baby. Different types of high blood pressure can happen during pregnancy. The types are:  High blood pressure before you got pregnant. This is called chronic hypertension.  This can continue during your pregnancy. Your doctor will want to keep checking your blood pressure. You may need medicine to keep your blood pressure under control while you are pregnant. You will need follow-up visits after you have your baby.  High blood pressure that goes up during pregnancy when it was normal before. This is called gestational hypertension. It will usually get better after you have your baby, but your doctor will need to watch your blood pressure to make sure that it is getting better.  Very high blood pressure during pregnancy. This is called preeclampsia. Very high blood pressure is an emergency that needs to be checked and treated right away.  You may develop very high blood pressure after giving birth. This is called postpartum preeclampsia. This usually occurs within 48 hours after childbirth but may occur up to 6 weeks after giving birth. This is rare. How does this affect me? If you have high blood pressure during pregnancy, you have a higher chance of developing high blood pressure:  As you get older.  If you get pregnant again. In some cases, high blood pressure during pregnancy can cause:  Stroke.  Heart attack.  Damage to the kidneys, lungs, or liver.  Preeclampsia.  Jerky movements you cannot control (convulsions or seizures).  Problems with the placenta. How does this affect my baby? Your baby may:  Be born early.  Not weigh as much as he or she should.  Not handle labor well, leading to a c-section birth. What are the risks?  Having high blood pressure during a past  pregnancy.  Being overweight.  Being 35 years old or older.  Being pregnant for the first time.  Being pregnant with more than one baby.  Becoming pregnant using fertility methods, such as IVF.  Having other problems, such as diabetes, or kidney disease.  Having family members who have high blood pressure. What can I do to lower my risk?   Keep a healthy weight.  Eat a healthy diet.  Follow what your doctor tells you about treating any medical problems that you had before becoming pregnant. It is very important to go to all of your doctor visits. Your doctor will check your blood pressure and make sure that your pregnancy is progressing as it should. Treatment should start early if a problem is found. How is this treated? Treatment for high blood pressure during pregnancy can differ depending on the type of high blood pressure you have and how serious it is.  You may need to take blood pressure medicine.  If you have been taking medicine for your blood pressure, you may need to change the medicine during pregnancy if it is not safe for your baby.  If your doctor thinks that you could get very high blood pressure, he or she may tell you to take a low-dose aspirin during your pregnancy.  If you have very high blood pressure, you may need to stay in the hospital so you and your baby can be watched closely. You may also need to take medicine to lower your blood pressure. This medicine may be given by mouth   or through an IV tube. °· In some cases, if your condition gets worse, you may need to have your baby early. °Follow these instructions at home: °Eating and drinking ° °· Drink enough fluid to keep your pee (urine) pale yellow. °· Avoid caffeine. °Lifestyle °· Do not use any products that contain nicotine or tobacco, such as cigarettes, e-cigarettes, and chewing tobacco. If you need help quitting, ask your doctor. °· Do not use alcohol or drugs. °· Avoid stress. °· Rest and get plenty  of sleep. °· Regular exercise can help. Ask your doctor what kinds of exercise are best for you. °General instructions °· Take over-the-counter and prescription medicines only as told by your doctor. °· Keep all prenatal and follow-up visits as told by your doctor. This is important. °Contact a doctor if: °· You have symptoms that your doctor told you to watch for, such as: °? Headaches. °? Nausea. °? Vomiting. °? Belly (abdominal) pain. °? Dizziness. °? Light-headedness. °Get help right away if: °· You have: °? Very bad belly pain that does not get better with treatment. °? A very bad headache that does not get better. °? Vomiting that does not get better. °? Sudden, fast weight gain. °? Sudden swelling in your hands, ankles, or face. °? Bleeding from your vagina. °? Blood in your pee. °? Blurry vision. °? Double vision. °? Shortness of breath. °? Chest pain. °? Weakness on one side of your body. °? Trouble talking. °· Your baby is not moving as much as usual. °Summary °· High blood pressure is also called hypertension. °· High blood pressure means that the force of your blood moving in your body is too strong. °· High blood pressure can cause problems for you and your baby. °· Keep all follow-up visits as told by your doctor. This is important. °This information is not intended to replace advice given to you by your health care provider. Make sure you discuss any questions you have with your health care provider. °Document Revised: 12/18/2018 Document Reviewed: 09/23/2018 °Elsevier Patient Education © 2020 [employer] Inc[employer] ° °Round Ligament Pain ° °The round ligament is a cord of muscle and tissue that helps support the uterus. It can become a source of pain during pregnancy if it becomes stretched or twisted as the baby grows. The pain usually begins in the second trimester (13-28 weeks) of pregnancy, and it can come and go until the baby is delivered. It is not a serious problem, and it does not cause harm to the  baby. °Round ligament pain is usually a short, sharp, and pinching pain, but it can also be a dull, lingering, and aching pain. The pain is felt in the lower side of the abdomen or in the groin. It usually starts deep in the groin and moves up to the outside of the hip area. The pain may occur when you: °· Suddenly change position, such as quickly going from a sitting to standing position. °· Roll over in bed. °· Cough or sneeze. °· Do physical activity. °Follow these instructions at home: ° °· Watch your condition for any changes. °· When the pain starts, relax. Then try any of these methods to help with the pain: °? Sitting down. °? Flexing your knees up to your abdomen. °? Lying on your side with one pillow under your abdomen and another pillow between your legs. °? Sitting in a warm bath for 15-20 minutes or until the pain goes away. °· Take over-the-counter and prescription medicines   only as told by your health care provider. °· Move slowly when you sit down or stand up. °· Avoid long walks if they cause pain. °· Stop or reduce your physical activities if they cause pain. °· Keep all follow-up visits as told by your health care provider. This is important. °Contact a health care provider if: °· Your pain does not go away with treatment. °· You feel pain in your back that you did not have before. °· Your medicine is not helping. °Get help right away if: °· You have a fever or chills. °· You develop uterine contractions. °· You have vaginal bleeding. °· You have nausea or vomiting. °· You have diarrhea. °· You have pain when you urinate. °Summary °· Round ligament pain is felt in the lower abdomen or groin. It is usually a short, sharp, and pinching pain. It can also be a dull, lingering, and aching pain. °· This pain usually begins in the second trimester (13-28 weeks). It occurs because the uterus is stretching with the growing baby, and it is not harmful to the baby. °· You may notice the pain when you  suddenly change position, when you cough or sneeze, or during physical activity. °· Relaxing, flexing your knees to your abdomen, lying on one side, or taking a warm bath may help to get rid of the pain. °· Get help from your health care provider if the pain does not go away or if you have vaginal bleeding, nausea, vomiting, diarrhea, or painful urination. °This information is not intended to replace advice given to you by your health care provider. Make sure you discuss any questions you have with your health care provider. °Document Revised: 02/12/2018 Document Reviewed: 02/12/2018 °Elsevier Patient Education © 2020 Elsevier Inc. ° °

## 2020-07-29 ENCOUNTER — Ambulatory Visit: Payer: Medicaid Other | Attending: Obstetrics and Gynecology

## 2020-07-29 ENCOUNTER — Ambulatory Visit: Payer: Medicaid Other

## 2020-08-03 ENCOUNTER — Ambulatory Visit: Payer: Medicaid Other | Attending: Obstetrics and Gynecology

## 2020-08-03 ENCOUNTER — Ambulatory Visit: Payer: Medicaid Other | Admitting: *Deleted

## 2020-08-03 ENCOUNTER — Other Ambulatory Visit: Payer: Self-pay | Admitting: *Deleted

## 2020-08-03 ENCOUNTER — Other Ambulatory Visit: Payer: Self-pay

## 2020-08-03 ENCOUNTER — Encounter: Payer: Self-pay | Admitting: *Deleted

## 2020-08-03 DIAGNOSIS — O10919 Unspecified pre-existing hypertension complicating pregnancy, unspecified trimester: Secondary | ICD-10-CM

## 2020-08-03 DIAGNOSIS — O10013 Pre-existing essential hypertension complicating pregnancy, third trimester: Secondary | ICD-10-CM | POA: Diagnosis not present

## 2020-08-03 DIAGNOSIS — R102 Pelvic and perineal pain: Secondary | ICD-10-CM | POA: Diagnosis not present

## 2020-08-03 DIAGNOSIS — O26893 Other specified pregnancy related conditions, third trimester: Secondary | ICD-10-CM | POA: Diagnosis not present

## 2020-08-03 DIAGNOSIS — O0990 Supervision of high risk pregnancy, unspecified, unspecified trimester: Secondary | ICD-10-CM | POA: Diagnosis present

## 2020-08-03 DIAGNOSIS — O0933 Supervision of pregnancy with insufficient antenatal care, third trimester: Secondary | ICD-10-CM

## 2020-08-03 DIAGNOSIS — Z3A33 33 weeks gestation of pregnancy: Secondary | ICD-10-CM

## 2020-08-03 NOTE — Progress Notes (Signed)
C/o" lower back pain" 

## 2020-08-10 ENCOUNTER — Ambulatory Visit: Payer: Medicaid Other | Admitting: *Deleted

## 2020-08-10 ENCOUNTER — Ambulatory Visit: Payer: Medicaid Other | Attending: Obstetrics and Gynecology

## 2020-08-10 ENCOUNTER — Encounter: Payer: Medicaid Other | Admitting: Obstetrics and Gynecology

## 2020-08-10 ENCOUNTER — Encounter: Payer: Self-pay | Admitting: *Deleted

## 2020-08-10 ENCOUNTER — Other Ambulatory Visit: Payer: Self-pay

## 2020-08-10 DIAGNOSIS — O0933 Supervision of pregnancy with insufficient antenatal care, third trimester: Secondary | ICD-10-CM | POA: Diagnosis not present

## 2020-08-10 DIAGNOSIS — O0990 Supervision of high risk pregnancy, unspecified, unspecified trimester: Secondary | ICD-10-CM | POA: Insufficient documentation

## 2020-08-10 DIAGNOSIS — Z362 Encounter for other antenatal screening follow-up: Secondary | ICD-10-CM

## 2020-08-10 DIAGNOSIS — Z3A34 34 weeks gestation of pregnancy: Secondary | ICD-10-CM

## 2020-08-10 DIAGNOSIS — O10913 Unspecified pre-existing hypertension complicating pregnancy, third trimester: Secondary | ICD-10-CM

## 2020-08-10 DIAGNOSIS — O10919 Unspecified pre-existing hypertension complicating pregnancy, unspecified trimester: Secondary | ICD-10-CM | POA: Insufficient documentation

## 2020-08-11 ENCOUNTER — Encounter: Payer: Medicaid Other | Admitting: Obstetrics and Gynecology

## 2020-08-17 ENCOUNTER — Encounter: Payer: Self-pay | Admitting: *Deleted

## 2020-08-17 ENCOUNTER — Ambulatory Visit: Payer: Medicaid Other | Admitting: *Deleted

## 2020-08-17 ENCOUNTER — Ambulatory Visit: Payer: Medicaid Other | Attending: Obstetrics and Gynecology

## 2020-08-17 ENCOUNTER — Other Ambulatory Visit: Payer: Self-pay

## 2020-08-17 DIAGNOSIS — Z3A35 35 weeks gestation of pregnancy: Secondary | ICD-10-CM | POA: Diagnosis not present

## 2020-08-17 DIAGNOSIS — O10913 Unspecified pre-existing hypertension complicating pregnancy, third trimester: Secondary | ICD-10-CM

## 2020-08-17 DIAGNOSIS — O0990 Supervision of high risk pregnancy, unspecified, unspecified trimester: Secondary | ICD-10-CM

## 2020-08-17 DIAGNOSIS — O0933 Supervision of pregnancy with insufficient antenatal care, third trimester: Secondary | ICD-10-CM

## 2020-08-17 DIAGNOSIS — O10919 Unspecified pre-existing hypertension complicating pregnancy, unspecified trimester: Secondary | ICD-10-CM | POA: Insufficient documentation

## 2020-08-17 DIAGNOSIS — Z362 Encounter for other antenatal screening follow-up: Secondary | ICD-10-CM | POA: Diagnosis not present

## 2020-08-17 NOTE — Progress Notes (Signed)
States" last dose of Labetalol today at 2:15pm."

## 2020-08-18 ENCOUNTER — Ambulatory Visit: Payer: Medicaid Other

## 2020-08-24 ENCOUNTER — Ambulatory Visit: Payer: Medicaid Other | Admitting: *Deleted

## 2020-08-24 ENCOUNTER — Ambulatory Visit: Payer: Medicaid Other | Attending: Obstetrics and Gynecology

## 2020-08-24 ENCOUNTER — Encounter: Payer: Self-pay | Admitting: Family Medicine

## 2020-08-24 ENCOUNTER — Other Ambulatory Visit: Payer: Self-pay

## 2020-08-24 ENCOUNTER — Encounter: Payer: Self-pay | Admitting: *Deleted

## 2020-08-24 DIAGNOSIS — O0990 Supervision of high risk pregnancy, unspecified, unspecified trimester: Secondary | ICD-10-CM | POA: Diagnosis present

## 2020-08-24 DIAGNOSIS — O10913 Unspecified pre-existing hypertension complicating pregnancy, third trimester: Secondary | ICD-10-CM

## 2020-08-24 DIAGNOSIS — Z362 Encounter for other antenatal screening follow-up: Secondary | ICD-10-CM

## 2020-08-24 DIAGNOSIS — O0933 Supervision of pregnancy with insufficient antenatal care, third trimester: Secondary | ICD-10-CM | POA: Diagnosis not present

## 2020-08-24 DIAGNOSIS — Z3A36 36 weeks gestation of pregnancy: Secondary | ICD-10-CM

## 2020-08-24 DIAGNOSIS — O10919 Unspecified pre-existing hypertension complicating pregnancy, unspecified trimester: Secondary | ICD-10-CM

## 2020-08-29 ENCOUNTER — Other Ambulatory Visit (HOSPITAL_COMMUNITY)
Admission: RE | Admit: 2020-08-29 | Discharge: 2020-08-29 | Disposition: A | Discharge status: Home / Self Care | Payer: Medicaid Other | Source: Ambulatory Visit | Attending: Obstetrics & Gynecology | Admitting: Obstetrics & Gynecology

## 2020-08-29 ENCOUNTER — Other Ambulatory Visit: Payer: Self-pay

## 2020-08-29 ENCOUNTER — Encounter: Payer: Self-pay | Admitting: Obstetrics & Gynecology

## 2020-08-29 ENCOUNTER — Ambulatory Visit (INDEPENDENT_AMBULATORY_CARE_PROVIDER_SITE_OTHER): Payer: Medicaid Other | Admitting: Obstetrics & Gynecology

## 2020-08-29 VITALS — BP 144/85 | HR 95 | Wt 196.6 lb

## 2020-08-29 DIAGNOSIS — Z3A37 37 weeks gestation of pregnancy: Secondary | ICD-10-CM

## 2020-08-29 DIAGNOSIS — O0993 Supervision of high risk pregnancy, unspecified, third trimester: Secondary | ICD-10-CM

## 2020-08-29 DIAGNOSIS — I10 Essential (primary) hypertension: Secondary | ICD-10-CM | POA: Diagnosis not present

## 2020-08-29 DIAGNOSIS — O0990 Supervision of high risk pregnancy, unspecified, unspecified trimester: Secondary | ICD-10-CM | POA: Insufficient documentation

## 2020-08-29 LAB — POCT URINALYSIS DIP (DEVICE)
Bilirubin Urine: NEGATIVE
Glucose, UA: NEGATIVE mg/dL
Hgb urine dipstick: NEGATIVE
Ketones, ur: NEGATIVE mg/dL
Leukocytes,Ua: NEGATIVE
Nitrite: NEGATIVE
Protein, ur: NEGATIVE mg/dL
Specific Gravity, Urine: >=1.03 (ref 1.005–1.030)
Urobilinogen, UA: 0.2 mg/dL (ref 0.0–1.0)
pH: 6.5 (ref 5.0–8.0)

## 2020-08-29 LAB — GLUCOSE, CAPILLARY: Glucose-Capillary: 82 mg/dL (ref 70–99)

## 2020-08-29 NOTE — Progress Notes (Signed)
Orders were put in however blue dot never appeared for me to go draw labs patient checked out without labs being drawn

## 2020-08-29 NOTE — Addendum Note (Signed)
Addended by: Marjo Bicker on: 08/29/2020 05:19 PM   Modules accepted: Orders
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
## 2020-08-30 ENCOUNTER — Telehealth (HOSPITAL_COMMUNITY): Payer: Self-pay | Admitting: *Deleted

## 2020-08-30 NOTE — Telephone Encounter (Signed)
Preadmission screen Message left with her mom

## 2020-08-31 ENCOUNTER — Ambulatory Visit: Payer: Medicaid Other

## 2020-08-31 ENCOUNTER — Telehealth (HOSPITAL_COMMUNITY): Payer: Self-pay | Admitting: *Deleted

## 2020-08-31 LAB — GC/CHLAMYDIA PROBE AMP (~~LOC~~) NOT AT ARMC
Chlamydia: NEGATIVE
Comment: NEGATIVE
Comment: NORMAL
Neisseria Gonorrhea: NEGATIVE

## 2020-08-31 NOTE — Telephone Encounter (Signed)
Preadmission screen  

## 2020-09-01 ENCOUNTER — Telehealth (HOSPITAL_COMMUNITY): Payer: Self-pay | Admitting: *Deleted

## 2020-09-01 NOTE — Telephone Encounter (Signed)
Preadmission screen  

## 2020-09-02 LAB — CULTURE, BETA STREP (GROUP B ONLY): Strep Gp B Culture: NEGATIVE

## 2020-09-05 ENCOUNTER — Encounter: Payer: Self-pay | Admitting: Obstetrics & Gynecology

## 2020-09-05 ENCOUNTER — Other Ambulatory Visit: Payer: Self-pay

## 2020-09-05 ENCOUNTER — Ambulatory Visit (INDEPENDENT_AMBULATORY_CARE_PROVIDER_SITE_OTHER): Payer: Medicaid Other | Admitting: Obstetrics & Gynecology

## 2020-09-05 VITALS — BP 131/84 | HR 93 | Wt 197.0 lb

## 2020-09-05 DIAGNOSIS — O0933 Supervision of pregnancy with insufficient antenatal care, third trimester: Secondary | ICD-10-CM | POA: Diagnosis not present

## 2020-09-05 DIAGNOSIS — Z3A38 38 weeks gestation of pregnancy: Secondary | ICD-10-CM | POA: Diagnosis not present

## 2020-09-05 DIAGNOSIS — O0993 Supervision of high risk pregnancy, unspecified, third trimester: Secondary | ICD-10-CM | POA: Diagnosis not present

## 2020-09-05 DIAGNOSIS — I10 Essential (primary) hypertension: Secondary | ICD-10-CM

## 2020-09-05 DIAGNOSIS — O0932 Supervision of pregnancy with insufficient antenatal care, second trimester: Secondary | ICD-10-CM | POA: Diagnosis not present

## 2020-09-05 DIAGNOSIS — O0990 Supervision of high risk pregnancy, unspecified, unspecified trimester: Secondary | ICD-10-CM | POA: Diagnosis not present
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
## 2020-09-06 LAB — CBC
Hematocrit: 31.8 % — ABNORMAL LOW (ref 34.0–46.6)
Hemoglobin: 10.4 g/dL — ABNORMAL LOW (ref 11.1–15.9)
MCH: 24.6 pg — ABNORMAL LOW (ref 26.6–33.0)
MCHC: 32.7 g/dL (ref 31.5–35.7)
MCV: 75 fL — ABNORMAL LOW (ref 79–97)
Platelets: 350 10*3/uL (ref 150–450)
RBC: 4.22 x10E6/uL (ref 3.77–5.28)
RDW: 14.5 % (ref 11.7–15.4)
WBC: 10.5 10*3/uL (ref 3.4–10.8)

## 2020-09-06 LAB — RPR: RPR Ser Ql: NONREACTIVE

## 2020-09-06 LAB — HEMOGLOBIN A1C
Est. average glucose Bld gHb Est-mCnc: 123 mg/dL
Hgb A1c MFr Bld: 5.9 % — ABNORMAL HIGH (ref 4.8–5.6)

## 2020-09-06 LAB — HIV ANTIBODY (ROUTINE TESTING W REFLEX): HIV Screen 4th Generation wRfx: NONREACTIVE

## 2020-09-07 ENCOUNTER — Ambulatory Visit: Payer: Self-pay

## 2020-09-07 ENCOUNTER — Other Ambulatory Visit: Payer: Self-pay

## 2020-09-07 ENCOUNTER — Ambulatory Visit (INDEPENDENT_AMBULATORY_CARE_PROVIDER_SITE_OTHER): Payer: Medicaid Other | Admitting: *Deleted

## 2020-09-07 VITALS — BP 123/70 | HR 92 | Wt 197.0 lb

## 2020-09-07 DIAGNOSIS — O10919 Unspecified pre-existing hypertension complicating pregnancy, unspecified trimester: Secondary | ICD-10-CM | POA: Diagnosis not present

## 2020-09-07 NOTE — Progress Notes (Signed)

## 2020-09-09 ENCOUNTER — Inpatient Hospital Stay (HOSPITAL_COMMUNITY): Payer: Medicaid Other | Admitting: Anesthesiology

## 2020-09-09 ENCOUNTER — Inpatient Hospital Stay (HOSPITAL_COMMUNITY)
Admission: AD | Admit: 2020-09-09 | Discharge: 2020-09-11 | DRG: 807 | Disposition: A | Discharge status: Home / Self Care | Payer: Medicaid Other | Attending: Family Medicine | Admitting: Family Medicine

## 2020-09-09 ENCOUNTER — Other Ambulatory Visit: Payer: Self-pay

## 2020-09-09 ENCOUNTER — Encounter (HOSPITAL_COMMUNITY): Payer: Self-pay | Admitting: Obstetrics & Gynecology

## 2020-09-09 DIAGNOSIS — O10919 Unspecified pre-existing hypertension complicating pregnancy, unspecified trimester: Secondary | ICD-10-CM | POA: Diagnosis present

## 2020-09-09 DIAGNOSIS — Z87891 Personal history of nicotine dependence: Secondary | ICD-10-CM | POA: Diagnosis not present

## 2020-09-09 DIAGNOSIS — O1002 Pre-existing essential hypertension complicating childbirth: Secondary | ICD-10-CM | POA: Diagnosis present

## 2020-09-09 DIAGNOSIS — Z3A38 38 weeks gestation of pregnancy: Secondary | ICD-10-CM

## 2020-09-09 DIAGNOSIS — O4202 Full-term premature rupture of membranes, onset of labor within 24 hours of rupture: Secondary | ICD-10-CM | POA: Diagnosis not present

## 2020-09-09 DIAGNOSIS — O9902 Anemia complicating childbirth: Secondary | ICD-10-CM | POA: Diagnosis present

## 2020-09-09 DIAGNOSIS — Z20822 Contact with and (suspected) exposure to covid-19: Secondary | ICD-10-CM | POA: Diagnosis present

## 2020-09-09 DIAGNOSIS — O26893 Other specified pregnancy related conditions, third trimester: Secondary | ICD-10-CM | POA: Diagnosis present

## 2020-09-09 DIAGNOSIS — D649 Anemia, unspecified: Secondary | ICD-10-CM | POA: Diagnosis present

## 2020-09-09 DIAGNOSIS — O0990 Supervision of high risk pregnancy, unspecified, unspecified trimester: Secondary | ICD-10-CM

## 2020-09-09 LAB — CBC
HCT: 31.7 % — ABNORMAL LOW (ref 36.0–46.0)
HCT: 31.3 % — ABNORMAL LOW (ref 36.0–46.0)
Hemoglobin: 9.7 g/dL — ABNORMAL LOW (ref 12.0–15.0)
Hemoglobin: 9.6 g/dL — ABNORMAL LOW (ref 12.0–15.0)
MCH: 23.8 pg — ABNORMAL LOW (ref 26.0–34.0)
MCH: 23.8 pg — ABNORMAL LOW (ref 26.0–34.0)
MCHC: 30.6 g/dL (ref 30.0–36.0)
MCHC: 30.7 g/dL (ref 30.0–36.0)
MCV: 77.7 fL — ABNORMAL LOW (ref 80.0–100.0)
MCV: 77.7 fL — ABNORMAL LOW (ref 80.0–100.0)
Platelets: 383 10*3/uL (ref 150–400)
Platelets: 373 10*3/uL (ref 150–400)
RBC: 4.08 MIL/uL (ref 3.87–5.11)
RBC: 4.03 MIL/uL (ref 3.87–5.11)
RDW: 15.2 % (ref 11.5–15.5)
RDW: 15.3 % (ref 11.5–15.5)
WBC: 12.8 10*3/uL — ABNORMAL HIGH (ref 4.0–10.5)
WBC: 10.7 10*3/uL — ABNORMAL HIGH (ref 4.0–10.5)
nRBC: 0 % (ref 0.0–0.2)
nRBC: 0 % (ref 0.0–0.2)

## 2020-09-09 LAB — RESP PANEL BY RT-PCR (FLU A&B, COVID) ARPGX2
Influenza A by PCR: NEGATIVE
Influenza B by PCR: NEGATIVE
SARS Coronavirus 2 by RT PCR: NEGATIVE

## 2020-09-09 LAB — URINALYSIS, ROUTINE W REFLEX MICROSCOPIC
Bilirubin Urine: NEGATIVE
Glucose, UA: NEGATIVE mg/dL
Ketones, ur: NEGATIVE mg/dL
Nitrite: NEGATIVE
Protein, ur: 100 mg/dL — AB
RBC / HPF: >50 RBC/hpf — ABNORMAL HIGH (ref 0–5)
Specific Gravity, Urine: 1.016 (ref 1.005–1.030)
Squamous Epithelial / HPF: >50 — ABNORMAL HIGH (ref 0–5)
pH: 6 (ref 5.0–8.0)

## 2020-09-09 LAB — COMPREHENSIVE METABOLIC PANEL
ALT: 15 U/L (ref 0–44)
AST: 18 U/L (ref 15–41)
Albumin: 2.6 g/dL — ABNORMAL LOW (ref 3.5–5.0)
Alkaline Phosphatase: 98 U/L (ref 38–126)
Anion gap: 9 (ref 5–15)
BUN: 10 mg/dL (ref 6–20)
CO2: 20 mmol/L — ABNORMAL LOW (ref 22–32)
Calcium: 8.7 mg/dL — ABNORMAL LOW (ref 8.9–10.3)
Chloride: 107 mmol/L (ref 98–111)
Creatinine, Ser: 0.56 mg/dL (ref 0.44–1.00)
GFR, Estimated: >60 mL/min (ref >60)
Glucose, Bld: 86 mg/dL (ref 70–99)
Potassium: 3.8 mmol/L (ref 3.5–5.1)
Sodium: 136 mmol/L (ref 135–145)
Total Bilirubin: 0.3 mg/dL (ref 0.3–1.2)
Total Protein: 6.1 g/dL — ABNORMAL LOW (ref 6.5–8.1)

## 2020-09-09 LAB — PROTEIN / CREATININE RATIO, URINE
Creatinine, Urine: 101.87 mg/dL
Protein Creatinine Ratio: 1.02 mg/mg{Cre} — ABNORMAL HIGH (ref 0.00–0.15)
Total Protein, Urine: 104 mg/dL

## 2020-09-09 LAB — TYPE AND SCREEN
ABO/RH(D): B POS
Antibody Screen: NEGATIVE

## 2020-09-09 LAB — RPR: RPR Ser Ql: NONREACTIVE

## 2020-09-09 MED ORDER — SODIUM CHLORIDE (PF) 0.9 % IJ SOLN
INTRAMUSCULAR | Status: DC | PRN
  Administered 2020-09-09: 12 mL/h via EPIDURAL

## 2020-09-09 MED ORDER — FLEET ENEMA 7-19 GM/118ML RE ENEM: 1.0000 | ENEMA | RECTAL | Status: DC | PRN

## 2020-09-09 MED ORDER — PHENYLEPHRINE 40 MCG/ML (10ML) SYRINGE FOR IV PUSH (FOR BLOOD PRESSURE SUPPORT): 80.0000 ug | PREFILLED_SYRINGE | INTRAVENOUS | Status: DC | PRN

## 2020-09-09 MED ORDER — TERBUTALINE SULFATE 1 MG/ML IJ SOLN: 0.2500 mg | Freq: Once | INTRAMUSCULAR | Status: DC | PRN

## 2020-09-09 MED ORDER — MISOPROSTOL 25 MCG QUARTER TABLET: 25.0000 ug | ORAL_TABLET | ORAL | Status: DC | PRN

## 2020-09-09 MED ORDER — DIPHENHYDRAMINE HCL 50 MG/ML IJ SOLN: 12.5000 mg | INTRAMUSCULAR | Status: DC | PRN

## 2020-09-09 MED ORDER — OXYTOCIN-SODIUM CHLORIDE 30-0.9 UT/500ML-% IV SOLN
2.5000 [IU]/h | INTRAVENOUS | Status: DC
  Filled 2020-09-09: qty 500

## 2020-09-09 MED ORDER — OXYTOCIN BOLUS FROM INFUSION
333.0000 mL | Freq: Once | INTRAVENOUS | Status: AC
  Administered 2020-09-09: 333 mL via INTRAVENOUS

## 2020-09-09 MED ORDER — LACTATED RINGERS IV SOLN: INTRAVENOUS | Status: DC

## 2020-09-09 MED ORDER — DIBUCAINE (PERIANAL) 1 % EX OINT: 1.0000 "application " | TOPICAL_OINTMENT | CUTANEOUS | Status: DC | PRN

## 2020-09-09 MED ORDER — SOD CITRATE-CITRIC ACID 500-334 MG/5ML PO SOLN: 30.0000 mL | ORAL | Status: DC | PRN

## 2020-09-09 MED ORDER — EPHEDRINE 5 MG/ML INJ: 10.0000 mg | INTRAVENOUS | Status: DC | PRN

## 2020-09-09 MED ORDER — ONDANSETRON HCL 4 MG/2ML IJ SOLN: 4.0000 mg | INTRAMUSCULAR | Status: DC | PRN

## 2020-09-09 MED ORDER — OXYCODONE-ACETAMINOPHEN 5-325 MG PO TABS
2.0000 | ORAL_TABLET | ORAL | Status: DC | PRN
Start: 2020-09-09 — End: 2020-09-09

## 2020-09-09 MED ORDER — OXYCODONE-ACETAMINOPHEN 5-325 MG PO TABS: 1.0000 | ORAL_TABLET | ORAL | Status: DC | PRN

## 2020-09-09 MED ORDER — LACTATED RINGERS IV SOLN: 500.0000 mL | Freq: Once | INTRAVENOUS | Status: DC

## 2020-09-09 MED ORDER — SIMETHICONE 80 MG PO CHEW: 80.0000 mg | CHEWABLE_TABLET | ORAL | Status: DC | PRN

## 2020-09-09 MED ORDER — PRENATAL MULTIVITAMIN CH
1.0000 | ORAL_TABLET | Freq: Every day | ORAL | Status: DC
  Administered 2020-09-10 – 2020-09-11 (×2): 1 via ORAL
  Filled 2020-09-09 (×2): qty 1

## 2020-09-09 MED ORDER — LIDOCAINE HCL (PF) 1 % IJ SOLN
INTRAMUSCULAR | Status: DC | PRN
  Administered 2020-09-09 (×2): 4 mL via EPIDURAL

## 2020-09-09 MED ORDER — DIPHENHYDRAMINE HCL 25 MG PO CAPS: 25.0000 mg | ORAL_CAPSULE | Freq: Four times a day (QID) | ORAL | Status: DC | PRN

## 2020-09-09 MED ORDER — WITCH HAZEL-GLYCERIN EX PADS: 1.0000 "application " | MEDICATED_PAD | CUTANEOUS | Status: DC | PRN

## 2020-09-09 MED ORDER — OXYTOCIN-SODIUM CHLORIDE 30-0.9 UT/500ML-% IV SOLN
1.0000 m[IU]/min | INTRAVENOUS | Status: DC
  Administered 2020-09-09: 2 m[IU]/min via INTRAVENOUS

## 2020-09-09 MED ORDER — TETANUS-DIPHTH-ACELL PERTUSSIS 5-2.5-18.5 LF-MCG/0.5 IM SUSY
0.5000 mL | PREFILLED_SYRINGE | Freq: Once | INTRAMUSCULAR | Status: DC
Start: 2020-09-10 — End: 2020-09-11

## 2020-09-09 MED ORDER — LIDOCAINE HCL (PF) 1 % IJ SOLN: 30.0000 mL | INTRAMUSCULAR | Status: DC | PRN

## 2020-09-09 MED ORDER — BENZOCAINE-MENTHOL 20-0.5 % EX AERO: 1.0000 "application " | INHALATION_SPRAY | CUTANEOUS | Status: DC | PRN

## 2020-09-09 MED ORDER — FENTANYL-BUPIVACAINE-NACL 0.5-0.125-0.9 MG/250ML-% EP SOLN
12.0000 mL/h | EPIDURAL | Status: DC | PRN
Start: 2020-09-09 — End: 2020-09-09
  Filled 2020-09-09: qty 250

## 2020-09-09 MED ORDER — COCONUT OIL OIL: 1.0000 "application " | TOPICAL_OIL | Status: DC | PRN

## 2020-09-09 MED ORDER — ZOLPIDEM TARTRATE 5 MG PO TABS: 5.0000 mg | ORAL_TABLET | Freq: Every evening | ORAL | Status: DC | PRN

## 2020-09-09 MED ORDER — LACTATED RINGERS IV SOLN: 500.0000 mL | INTRAVENOUS | Status: DC | PRN

## 2020-09-09 MED ORDER — IBUPROFEN 600 MG PO TABS
600.0000 mg | ORAL_TABLET | Freq: Four times a day (QID) | ORAL | Status: DC
  Administered 2020-09-09 – 2020-09-11 (×8): 600 mg via ORAL
  Filled 2020-09-09 (×8): qty 1

## 2020-09-09 MED ORDER — ONDANSETRON HCL 4 MG PO TABS: 4.0000 mg | ORAL_TABLET | ORAL | Status: DC | PRN

## 2020-09-09 MED ORDER — SENNOSIDES-DOCUSATE SODIUM 8.6-50 MG PO TABS
2.0000 | ORAL_TABLET | Freq: Every day | ORAL | Status: DC
  Administered 2020-09-11: 2 via ORAL
  Filled 2020-09-09 (×3): qty 2

## 2020-09-09 MED ORDER — ACETAMINOPHEN 325 MG PO TABS
650.0000 mg | ORAL_TABLET | ORAL | Status: DC | PRN
  Administered 2020-09-09 – 2020-09-10 (×3): 650 mg via ORAL
  Filled 2020-09-09 (×3): qty 2

## 2020-09-09 MED ORDER — ONDANSETRON HCL 4 MG/2ML IJ SOLN: 4.0000 mg | Freq: Four times a day (QID) | INTRAMUSCULAR | Status: DC | PRN

## 2020-09-09 MED ORDER — ACETAMINOPHEN 325 MG PO TABS: 650.0000 mg | ORAL_TABLET | ORAL | Status: DC | PRN

## 2020-09-09 NOTE — Progress Notes (Signed)
   Brittany Rasmussen is a 19 y.o. G3P1011 at [redacted]w[redacted]d  admitted for induction of labor due to Spontaneous rupture of BOW.  Subjective: Feeling tired Objective: Vitals:   09/09/20 1001 09/09/20 1031 09/09/20 1101 09/09/20 1130  BP: 133/84 137/81 137/88 125/71  Pulse: 95 82 79 76  Resp:    16  Temp:    97.6 F (36.4 C)  TempSrc:    Oral  SpO2:      Weight:      Height:       Total I/O In: -  Out: 600 [Urine:600]  FHT:  FHR: 125 bpm, variability: moderate,  accelerations:  Present,  decelerations:  Absent UC:   irregular, every 2-3 minutes SVE:   Dilation: 8 Effacement (%): 80 Station: Plus 1 Exam by:: Brittany Rasmussen Pitocin @ 6 mu/min  Labs: Lab Results  Component Value Date   WBC 10.7 (H) 09/09/2020   HGB 9.6 (L) 09/09/2020   HCT 31.3 (L) 09/09/2020   MCV 77.7 (L) 09/09/2020   PLT 373 09/09/2020    Assessment / Plan: Augmentation of labor, progressing well  Labor: Now 8 cm, anticipate NSVD Fetal Wellbeing:  Category I Pain Control:  Epidural Anticipated MOD:  NSVD  Brittany Rasmussen 09/09/2020, 11:44 AM
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
## 2020-09-09 NOTE — Lactation Note (Signed)
This note was copied from a baby's chart. Lactation Consultation Note  Patient Name: Brittany Rasmussen [id number] Date: 09/09/2020 Reason for consult: L&D Initial assessment Age:20 hours [name] almost 1 hour old.  He is under warmer at this time. Mom reports her last breastfeeding experience was not good and she had a lot of pain.  Mom reports she is considering pumping and bottle feeding.  Praised mom for breastfeeding again.  Inquired about doing some hand expression and spoon feeding since baby under warmer.  Baby cuing and loudly sucking hands. Mom asked if it would hurt.  Let her know it shouldn't.  Mom massaged her breasts and LC attempted to hand express.  Mom reports comfortable and LC fed [name] a few drops of colostrum from moms right breast.  Lc sat him up slightly under warmer and fed him.  He continued to smack.  Asked mom if we could go to the left breast and try to get out some drops.  Mom agreed.  LC attempted a few minutes and unable to get drops from left.  Mom cold shivering.   Baby's temperature has come up and RN assessing him.  Urged mom to put him STS s soon as he was warm enough.  Grandmother requested mom be seen later today by lactation. Let her know LC would follow up later today but to please let RN know if needed sooner. Maternal Data Formula Feeding for Exclusion: Yes Reason for exclusion: Mother's choice to formula and breast feed on admission Has patient been taught Hand Expression?: Yes Does the patient have breastfeeding experience prior to this delivery?: Yes  Feeding Feeding Type: Breast Milk  LATCH Score                   Interventions Interventions: Breast feeding basics reviewed  Lactation Tools Discussed/Used     Consult Status Consult Status: Follow-up Date: 09/09/20 Follow-up type: In-patient    Delissa Silba Michaelle Copas 09/09/2020, 1:48 PM
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
## 2020-09-09 NOTE — MAU Note (Signed)
Brittany Rasmussen is a 19 y.o. at [redacted]w[redacted]d here in MAU reporting: LOF since around midnight. Fluid is yellow. States she is still feeling leaking. Having some occasional contractions. +FM. No bleeding.  Onset of complaint: today  Pain score: 6/10  Vitals:   09/09/20 0111  BP: (!) 142/85  Pulse: (!) 108  Resp: 16  Temp: 98.5 F (36.9 C)  SpO2: 100%     FHT: +FM  Lab orders placed from triage: none

## 2020-09-09 NOTE — Anesthesia Procedure Notes (Signed)
Epidural Patient location during procedure: OB Start time: 09/09/2020 7:40 AM End time: 09/09/2020 7:47 AM  Staffing Anesthesiologist: Mal Amabile, MD Performed: anesthesiologist   Preanesthetic Checklist Completed: patient identified, IV checked, site marked, risks and benefits discussed, surgical consent, monitors and equipment checked, pre-op evaluation and timeout performed  Epidural Patient position: sitting Prep: DuraPrep and site prepped and draped Patient monitoring: continuous pulse ox and blood pressure Approach: midline Location: L4-L5 Injection technique: LOR air  Needle:  Needle type: Tuohy  Needle gauge: 17 G Needle length: 9 cm and 9 Needle insertion depth: 6 cm Catheter type: closed end flexible Catheter size: 19 Gauge Catheter at skin depth: 11 cm Test dose: negative and Other  Assessment Events: blood not aspirated, injection not painful, no injection resistance, no paresthesia and negative IV test  Additional Notes Patient identified. Risks and benefits discussed including failed block, incomplete  Pain control, post dural puncture headache, nerve damage, paralysis, blood pressure Changes, nausea, vomiting, reactions to medications-both toxic and allergic and post Partum back pain. All questions were answered. Patient expressed understanding and wished to proceed. Sterile technique was used throughout procedure. Epidural site was Dressed with sterile barrier dressing. No paresthesias, signs of intravascular injection Or signs of intrathecal spread were encountered.  Patient was more comfortable after the epidural was dosed. Please see RN's note for documentation of vital signs and FHR which are stable. Reason for block:procedure for pain

## 2020-09-10 MED ORDER — FERROUS SULFATE 325 (65 FE) MG PO TABS
325.0000 mg | ORAL_TABLET | ORAL | Status: DC
  Administered 2020-09-10: 325 mg via ORAL
  Filled 2020-09-10: qty 1

## 2020-09-10 NOTE — Progress Notes (Signed)
POSTPARTUM PROGRESS NOTE  Subjective: Brittany Rasmussen is a 20 y.o. G3P1011 s/p vaginal delivery at [redacted]w[redacted]d.  She reports she doing well. No acute events overnight. She denies any problems with ambulating, voiding or po intake. Denies nausea or vomiting. She has passed flatus. Pain is well controlled.  Lochia is minimal.  Objective: Blood pressure 128/79, pulse 76, temperature 97.9 F (36.6 C), temperature source Oral, resp. rate 18, height 5\' 5"  (1.651 m), weight 90.1 kg, last menstrual period 12/13/2019, SpO2 100 %, unknown if currently breastfeeding.  Physical Exam:  General: alert, cooperative and no distress Chest: no respiratory distress Abdomen: soft, non-tender  Uterine Fundus: firm and at level of umbilicus Extremities: No calf swelling or tenderness  no LE edema  Recent Labs    09/09/20 0145 09/09/20 0606  HGB 9.7* 9.6*  HCT 31.7* 31.3*    Assessment/Plan: Brittany Rasmussen is a 20 y.o. G3P1011 s/p vaginal delivery at [redacted]w[redacted]d.  Routine Postpartum Care: Doing well, pain well-controlled.  -- Continue routine care, lactation support  -- Contraception: POPs -- Feeding: breast & bottle -- cHTN: Blood pressure in normal range this AM. Asymptomatic. Will continue to monitor. -- Anemia: Hgb 9.7 on admission. Started po iron today with plan to continue on discharge.  Dispo: Plan for discharge PPD#2.  [redacted]w[redacted]d, MD OB Fellow, Faculty Practice 09/10/2020 9:16 AM

## 2020-09-10 NOTE — Anesthesia Postprocedure Evaluation (Signed)
Anesthesia Post Note  Patient: Brittany Rasmussen  Procedure(s) Performed: AN AD HOC LABOR EPIDURAL     Patient location during evaluation: Mother Baby Anesthesia Type: Epidural Level of consciousness: awake and alert Pain management: pain level controlled Vital Signs Assessment: post-procedure vital signs reviewed and stable Respiratory status: spontaneous breathing, nonlabored ventilation and respiratory function stable Cardiovascular status: stable Postop Assessment: no headache, no backache and epidural receding Anesthetic complications: no   No complications documented.  Last Vitals:  Vitals:   09/09/20 2310 09/10/20 0330  BP: 130/81 128/79  Pulse: 82 76  Resp: 16 18  Temp: 36.8 C 36.6 C  SpO2:      Last Pain:  Vitals:   09/10/20 0712  TempSrc:   PainSc: 0-No pain   Pain Goal:                   Rica Records
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
## 2020-09-11 ENCOUNTER — Encounter (HOSPITAL_COMMUNITY): Payer: Self-pay | Admitting: *Deleted

## 2020-09-11 MED ORDER — AMLODIPINE BESYLATE 5 MG PO TABS
5.0000 mg | ORAL_TABLET | Freq: Every day | ORAL | Status: DC
  Administered 2020-09-11: 5 mg via ORAL
  Filled 2020-09-11: qty 1

## 2020-09-11 MED ORDER — FERROUS SULFATE 325 (65 FE) MG PO TABS: 325.0000 mg | ORAL_TABLET | ORAL | 3 refills | Status: AC

## 2020-09-11 MED ORDER — AMLODIPINE BESYLATE 5 MG PO TABS: 5.0000 mg | ORAL_TABLET | Freq: Every day | ORAL | 1 refills | Status: AC

## 2020-09-11 MED ORDER — IBUPROFEN 600 MG PO TABS: 600.0000 mg | ORAL_TABLET | Freq: Four times a day (QID) | ORAL | 0 refills | Status: AC

## 2020-09-11 MED ORDER — ACETAMINOPHEN 325 MG PO TABS: 650.0000 mg | ORAL_TABLET | ORAL | 0 refills | Status: AC | PRN

## 2020-09-11 NOTE — Discharge Instructions (Signed)

## 2020-09-12 ENCOUNTER — Inpatient Hospital Stay (HOSPITAL_COMMUNITY)
Admission: AD | Admit: 2020-09-12 | Payer: Medicaid Other | Source: Home / Self Care | Admitting: Obstetrics & Gynecology

## 2020-09-12 ENCOUNTER — Inpatient Hospital Stay (HOSPITAL_COMMUNITY): Payer: Medicaid Other

## 2020-09-16 ENCOUNTER — Ambulatory Visit: Payer: Medicaid Other

## 2020-10-07 ENCOUNTER — Ambulatory Visit: Payer: Medicaid Other | Admitting: Family Medicine

## 2020-11-01 ENCOUNTER — Ambulatory Visit (INDEPENDENT_AMBULATORY_CARE_PROVIDER_SITE_OTHER): Payer: Medicaid Other | Admitting: Family Medicine

## 2020-11-01 ENCOUNTER — Encounter: Payer: Self-pay | Admitting: Family Medicine

## 2020-11-01 ENCOUNTER — Other Ambulatory Visit: Payer: Self-pay

## 2020-11-01 DIAGNOSIS — I10 Essential (primary) hypertension: Secondary | ICD-10-CM

## 2020-11-01 MED ORDER — NORETHINDRONE 0.35 MG PO TABS
1.0000 | ORAL_TABLET | Freq: Every day | ORAL | 11 refills | Status: AC
Start: 2020-11-01 — End: ?

## 2020-11-01 NOTE — Progress Notes (Signed)
Post Partum Visit Note  Brittany Rasmussen is a 20 y.o. G28P1011 female who presents for a postpartum visit. She is 7 weeks 4 days postpartum following a normal spontaneous vaginal delivery.  I have fully reviewed the prenatal and intrapartum course. The delivery was at 38w 5d.  Anesthesia: epidural. Postpartum course has been uneventful. Baby is doing well. Baby is feeding by bottle - Similac Advance. Bleeding no bleeding. Bowel function is normal. Bladder function is normal. Patient is not sexually active. Cont raception method is OCP (estrogen/progesterone). Postpartum depression screening: negative.   The pregnancy intention screening data noted above was reviewed. Patient desires to proceed with Nexplanon for birth control after counseling.    Edinburgh Postnatal Depression Scale - 11/01/20 1540      Edinburgh Postnatal Depression Scale:  In the Past 7 Days   I have been able to laugh and see the funny side of things. 0    I have looked forward with enjoyment to things. 0    I have blamed myself unnecessarily when things went wrong. 0    I have been anxious or worried for no good reason. 1    I have felt scared or panicky for no good reason. 0    Things have been getting on top of me. 1    I have been so unhappy that I have had difficulty sleeping. 0    I have felt sad or miserable. 0    I have been so unhappy that I have been crying. 0    The thought of harming myself has occurred to me. 0    Edinburgh Postnatal Depression Scale Total 2            The following portions of the patient's history were reviewed and updated as appropriate: allergies, current medications, past family history, past medical history, past social history, past surgical history and problem list.  Review of Systems Pertinent items noted in HPI and remainder of comprehensive ROS otherwise negative.    Objective:  BP 138/89   Pulse 86   Wt 173 lb (78.5 kg)   LMP 10/25/2020 (Approximate)    Breastfeeding No   BMI 28.79 kg/m    General:  alert, cooperative and appears stated age   Breasts:  deferred  Lungs: comfortable on room air        Assessment:    Normal postpartum exam. Pap smear not done at today's visit.   Plan:   Essential components of care per ACOG recommendations:  1.  Mood and well being: Patient with negative depression screening today. Reviewed local resources for support.  - Patient does use tobacco.  - hx of drug use? No    2. Infant care and feeding:  -Patient currently breastmilk feeding? No  -Social determinants of health (SDOH) reviewed in EPIC. No concerns  3. Sexuality, contraception and birth spacing - Patient does not want a pregnancy in the next year.  Desired family size is 2 children.  - Reviewed forms of contraception in tiered fashion. Patient desired POPs today for bridge therapy and then a return in the next few weeks for Nexplanon. Counseled to use protection until she returns.    - Discussed birth spacing of 18 months  4. Sleep and fatigue -Encouraged family/partner/community support of 4 hrs of uninterrupted sleep to help with mood and fatigue  5. Physical Recovery  - Discussed patients delivery and complications - Patient had no perineal lacerations - Patient has urinary incontinence? No -  Patient is safe to resume physical and sexual activity  6.  Health Maintenance - Last pap smear: n/a, not yet due - Mammogram: n/a, not yet due  7. Chronic Disease - cHTN: Not taking amlodipine, BP well controlled, follow up w PCP  Venora Maples, MD [facility], [facility]

## 2020-11-01 NOTE — Patient Instructions (Signed)
Etonogestrel implant What is this medicine? ETONOGESTREL (et oh noe JES trel) is a contraceptive (birth control) device. It is used to prevent pregnancy. It can be used for up to 3 years. This medicine may be used for other purposes; ask your health care provider or pharmacist if you have questions. COMMON BRAND NAME(S): Implanon, Nexplanon What should I tell my health care provider before I take this medicine? They need to know if you have any of these conditions:  abnormal vaginal bleeding  blood vessel disease or blood clots  breast, cervical, endometrial, ovarian, liver, or uterine cancer  diabetes  gallbladder disease  heart disease or recent heart attack  high blood pressure  high cholesterol or triglycerides  kidney disease  liver disease  migraine headaches  seizures  stroke  tobacco smoker  an unusual or allergic reaction to etonogestrel, anesthetics or antiseptics, other medicines, foods, dyes, or preservatives  pregnant or trying to get pregnant  breast-feeding How should I use this medicine? This device is inserted just under the skin on the inner side of your upper arm by a health care professional. Talk to your pediatrician regarding the use of this medicine in children. Special care may be needed. Overdosage: If you think you have taken too much of this medicine contact a poison control center or emergency room at once. NOTE: This medicine is only for you. Do not share this medicine with others. What if I miss a dose? This does not apply. What may interact with this medicine? Do not take this medicine with any of the following medications:  amprenavir  fosamprenavir This medicine may also interact with the following medications:  acitretin  aprepitant  armodafinil  bexarotene  bosentan  carbamazepine  certain medicines for fungal infections like fluconazole, ketoconazole, itraconazole and voriconazole  certain medicines to treat  hepatitis, HIV or AIDS  cyclosporine  felbamate  griseofulvin  lamotrigine  modafinil  oxcarbazepine  phenobarbital  phenytoin  primidone  rifabutin  rifampin  rifapentine  St. John's wort  topiramate This list may not describe all possible interactions. Give your health care provider a list of all the medicines, herbs, non-prescription drugs, or dietary supplements you use. Also tell them if you smoke, drink alcohol, or use illegal drugs. Some items may interact with your medicine. What should I watch for while using this medicine? This product does not protect you against HIV infection (AIDS) or other sexually transmitted diseases. You should be able to feel the implant by pressing your fingertips over the skin where it was inserted. Contact your doctor if you cannot feel the implant, and use a non-hormonal birth control method (such as condoms) until your doctor confirms that the implant is in place. Contact your doctor if you think that the implant may have broken or become bent while in your arm. You will receive a user card from your health care provider after the implant is inserted. The card is a record of the location of the implant in your upper arm and when it should be removed. Keep this card with your health records. What side effects may I notice from receiving this medicine? Side effects that you should report to your doctor or health care professional as soon as possible:  allergic reactions like skin rash, itching or hives, swelling of the face, lips, or tongue  breast lumps, breast tissue changes, or discharge  breathing problems  changes in emotions or moods  coughing up blood  if you feel that the implant   may have broken or bent while in your arm  high blood pressure  pain, irritation, swelling, or bruising at the insertion site  scar at site of insertion  signs of infection at the insertion site such as fever, and skin redness, pain or  discharge  signs and symptoms of a blood clot such as breathing problems; changes in vision; chest pain; severe, sudden headache; pain, swelling, warmth in the leg; trouble speaking; sudden numbness or weakness of the face, arm or leg  signs and symptoms of liver injury like dark yellow or brown urine; general ill feeling or flu-like symptoms; light-colored stools; loss of appetite; nausea; right upper belly pain; unusually weak or tired; yellowing of the eyes or skin  unusual vaginal bleeding, discharge Side effects that usually do not require medical attention (report to your doctor or health care professional if they continue or are bothersome):  acne  breast pain or tenderness  headache  irregular menstrual bleeding  nausea This list may not describe all possible side effects. Call your doctor for medical advice about side effects. You may report side effects to FDA at 1-800-FDA-1088. Where should I keep my medicine? This drug is given in a hospital or clinic and will not be stored at home. NOTE: This sheet is a summary. It may not cover all possible information. If you have questions about this medicine, talk to your doctor, pharmacist, or health care provider.  2021 Elsevier/Gold Standard (2019-06-09 11:33:04)  

## 2021-01-23 ENCOUNTER — Emergency Department (HOSPITAL_COMMUNITY)
Admission: EM | Admit: 2021-01-23 | Discharge: 2021-01-23 | Discharge status: Against Medical Advice | Payer: Medicaid Other | Attending: Physician Assistant | Admitting: Physician Assistant

## 2021-01-23 ENCOUNTER — Encounter (HOSPITAL_COMMUNITY): Payer: Self-pay

## 2021-01-23 DIAGNOSIS — Z28310 Unvaccinated for covid-19: Secondary | ICD-10-CM | POA: Diagnosis not present

## 2021-01-23 DIAGNOSIS — R509 Fever, unspecified: Secondary | ICD-10-CM | POA: Diagnosis not present

## 2021-01-23 DIAGNOSIS — Z20822 Contact with and (suspected) exposure to covid-19: Secondary | ICD-10-CM | POA: Diagnosis not present

## 2021-01-23 DIAGNOSIS — Z5321 Procedure and treatment not carried out due to patient leaving prior to being seen by health care provider: Secondary | ICD-10-CM | POA: Insufficient documentation

## 2021-01-23 DIAGNOSIS — J029 Acute pharyngitis, unspecified: Secondary | ICD-10-CM | POA: Insufficient documentation

## 2021-01-23 LAB — RESP PANEL BY RT-PCR (FLU A&B, COVID) ARPGX2
Influenza A by PCR: NEGATIVE
Influenza B by PCR: NEGATIVE
SARS Coronavirus 2 by RT PCR: NEGATIVE

## 2021-01-23 MED ORDER — ACETAMINOPHEN 500 MG PO TABS: 1000.0000 mg | ORAL_TABLET | Freq: Once | ORAL | Status: DC

## 2021-01-23 MED ORDER — ACETAMINOPHEN 325 MG PO TABS
650.0000 mg | ORAL_TABLET | Freq: Once | ORAL | Status: AC
  Administered 2021-01-23: 650 mg via ORAL
  Filled 2021-01-23: qty 2

## 2021-01-23 NOTE — ED Provider Notes (Signed)
Emergency Medicine Provider Triage Evaluation Note  Brittany Rasmussen , a 20 y.o. female  was evaluated in triage.  Pt complains of fever, body aches x  Unvaccinated. She is 7 weeks post partum.   Review of Systems  Positive: Fever, headache Negative: Chest pain, sob  Physical Exam  BP 139/89 (BP Location: Left Arm)   Pulse (!) 128   Temp (!) 102.7 F (39.3 C) (Oral)   Resp 16   Ht 5\' 5"  (1.651 m)   Wt 77.1 kg   SpO2 100%   BMI 28.29 kg/m  Gen:   Awake, no distress   Resp:  Normal effort  MSK:   Moves extremities without difficulty  Other:    Medical Decision Making  Medically screening exam initiated at 8:41 PM.  Appropriate orders placed.  was informed that the remainder of the evaluation will be completed by another provider, this initial triage assessment does not replace that evaluation, and the importance of remaining in the ED until their evaluation is complete.  Patient here with fever to 102.7 along with headache and body aches   Brittany Rasmussen, Brittany Rasmussen 01/23/21 2107    2108, MD 01/24/21 [phone]

## 2021-01-23 NOTE — ED Notes (Signed)
Pt called back into the ER to return a call. Asked patient if she had left the ER because they called her to complete vitals. Patient states that she had to leave to pick up her child. Asked about covid results and states that she will get them off her MyChart.

## 2021-01-23 NOTE — ED Triage Notes (Signed)
Pt reports that she has been having fever, sore throat and body aches since yesterday, pt unvaccinated

## 2021-01-23 NOTE — ED Notes (Signed)
NA X4.  

## 2021-01-23 NOTE — ED Notes (Signed)
Pt name called three times for vitals and no answer

## 2021-01-27 ENCOUNTER — Ambulatory Visit: Payer: Medicaid Other | Admitting: Family Medicine

## 2022-08-05 IMAGING — US US MFM OB DETAIL+14 WK
1 series · 13 of 28 positions shown · non-contrast
Comparison: none

[Series 1: us mfm ob detail+14 wk · 13 of 64 slices shown]
[im 3/64]
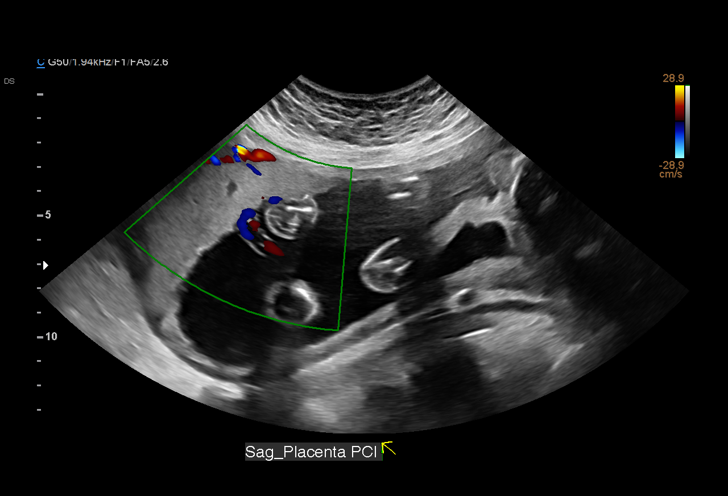
[im 8/64]
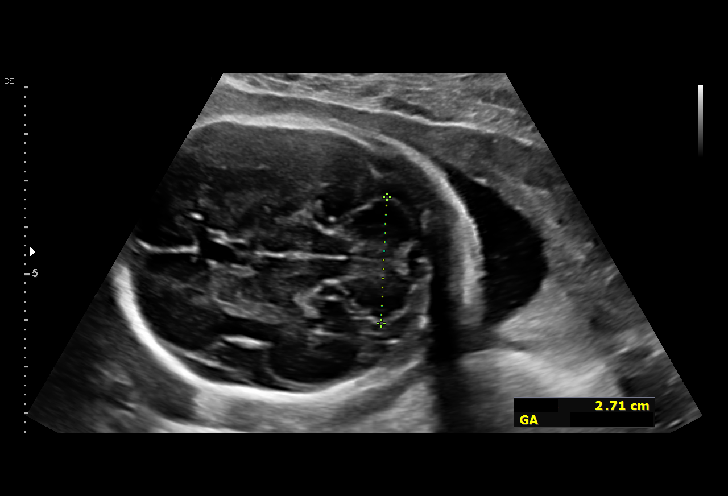
[im 12/64]
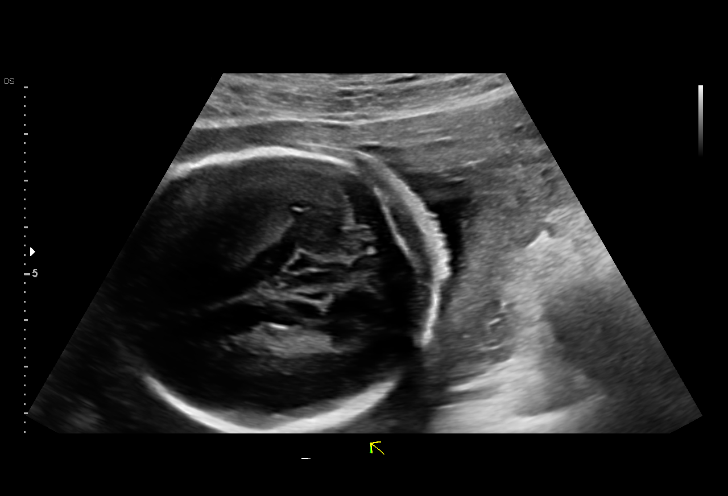
[im 17/64]
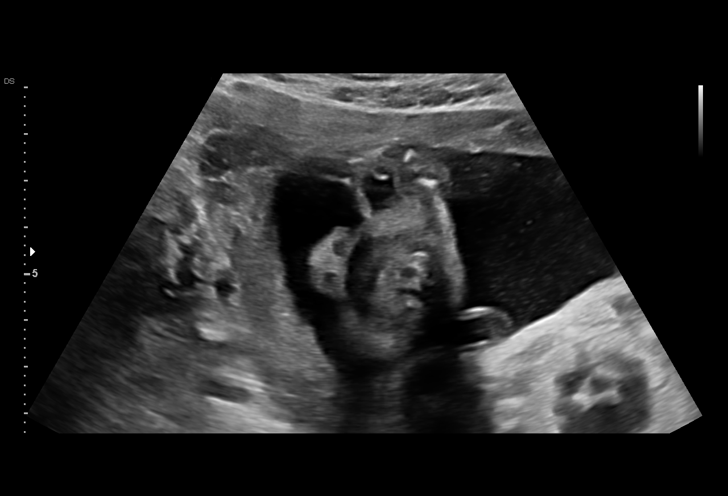
[im 22/64]
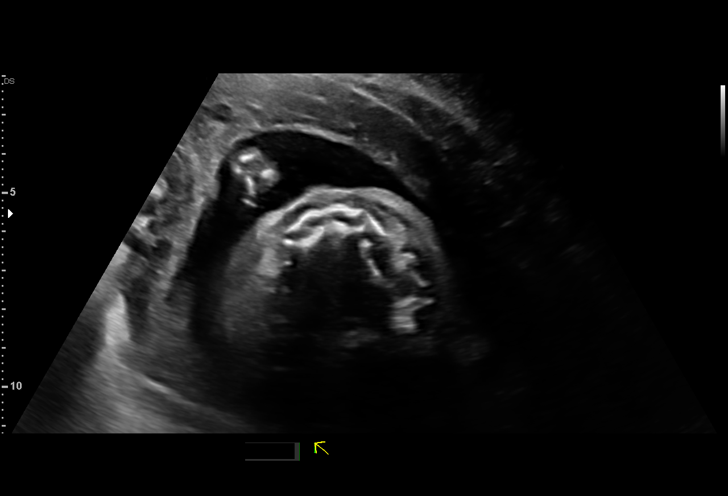
[im 26/64]
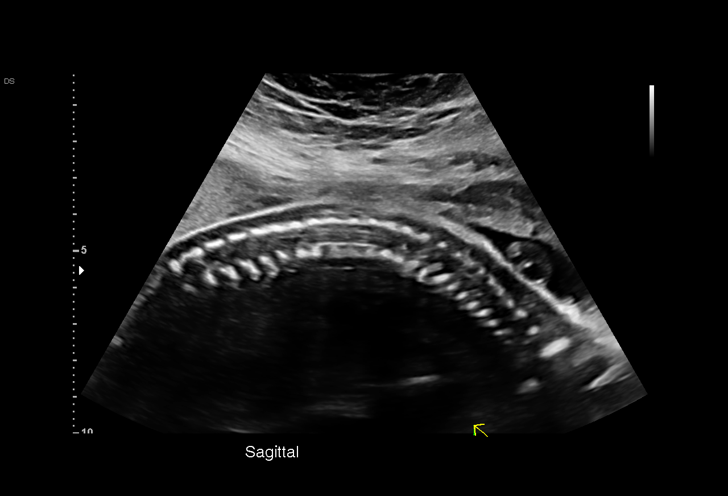
[im 33/64]
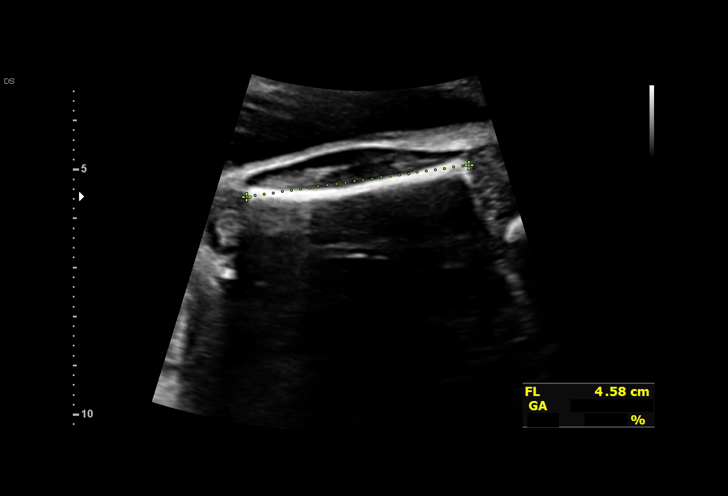
[im 38/64]
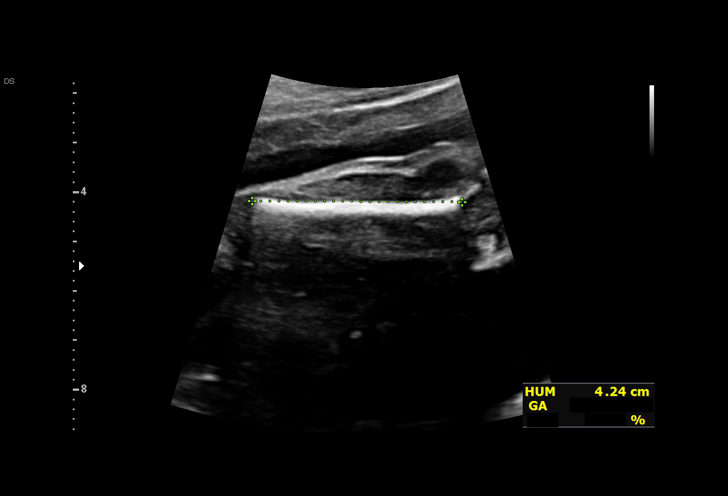
[im 43/64]
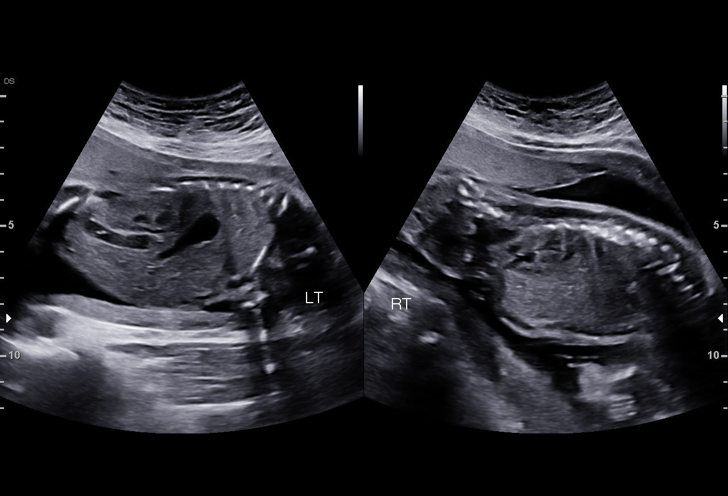
[im 47/64]
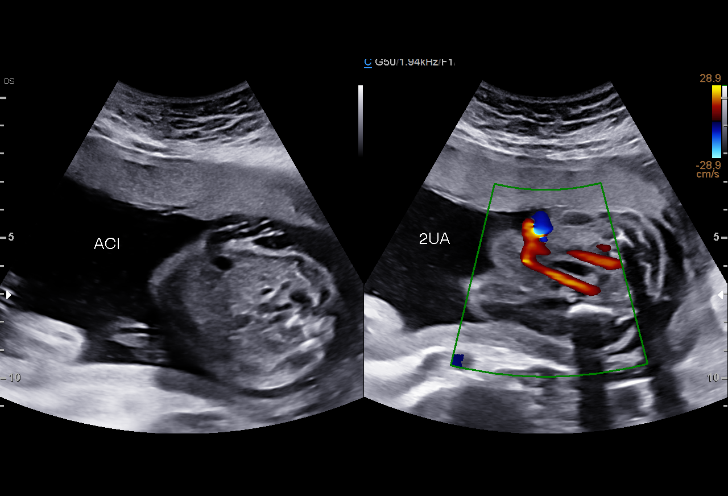
[im 52/64]
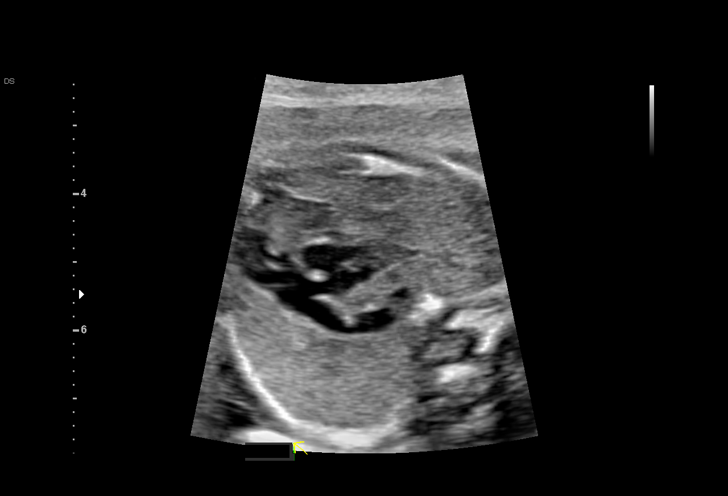
[im 57/64]
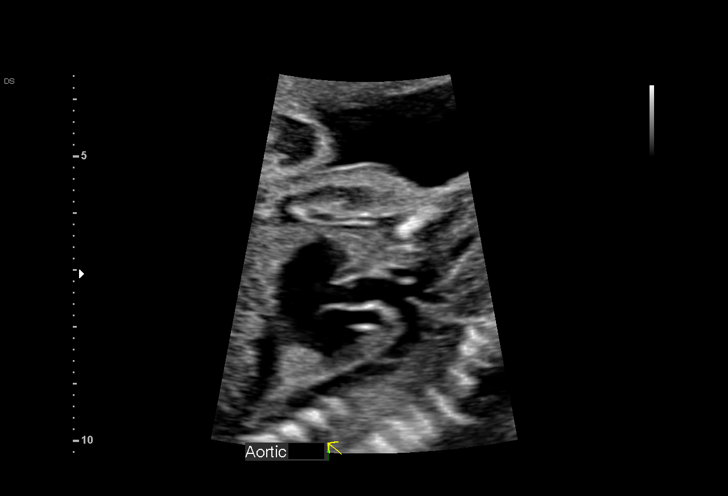
[im 61/64]
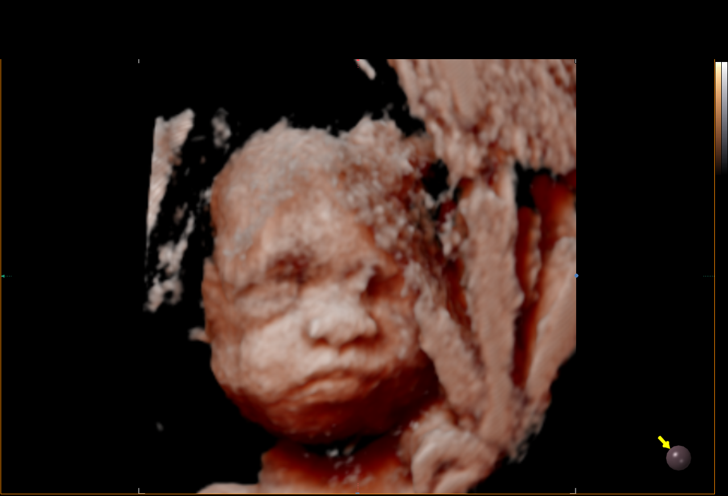

[13 of 28 positions shown; findings below may reference images not displayed]

Indications

 Late prenatal care, second trimester
 Hypertension - Chronic/Pre-existing
 25 weeks gestation of pregnancy
 Pelvic pain affecting pregnancy in second
 trimester
Fetal Evaluation

 Num Of Fetuses:         1
 Cardiac Activity:       Observed
 Presentation:           Cephalic
 Placenta:               Anterior Fundal
 P. Cord Insertion:      Visualized, central

 Amniotic Fluid
 AFI FV:      Within normal limits

                             Largest Pocket(cm)

Biometry

 BPD:      60.4  mm     G. Age:  24w 4d         23  %    CI:        71.85   %    70 - 86
                                                         FL/HC:      19.9   %    18.7 -
 HC:      226.8  mm     G. Age:  24w 5d         16  %    HC/AC:      1.06        1.04 -
 AC:      213.1  mm     G. Age:  25w 6d         63  %    FL/BPD:     74.8   %    71 - 87
 FL:       45.2  mm     G. Age:  25w 0d         30  %    FL/AC:      21.2   %    20 - 24
 HUM:      41.6  mm     G. Age:  25w 1d         42  %
 CER:      27.3  mm     G. Age:  24w 3d         33  %

 LV:        4.6  mm
 CM:        5.9  mm
 Est. FW:     797  gm    1 lb 12 oz      48  %
Gestational Age

 LMP:           25w 1d        Date:  12/13/19                 EDD:   09/18/20
 U/S Today:     25w 0d                                        EDD:   09/19/20
 Best:          25w 1d     Det. By:  LMP  (12/13/19)          EDD:   09/18/20
Anatomy

 Cranium:               Appears normal         LVOT:                   Appears normal
 Cavum:                 Appears normal         Aortic Arch:            Appears normal
 Ventricles:            Appears normal         Ductal Arch:            Appears normal
 Choroid Plexus:        Appears normal         Diaphragm:              Appears normal
 Cerebellum:            Appears normal         Stomach:                Appears normal, left
                                                                       sided
 Posterior Fossa:       Appears normal         Abdomen:                Appears normal
 Nuchal Fold:           Not applicable (>20    Abdominal Wall:         Appears nml (cord
                        wks GA)                                        insert, abd wall)
 Face:                  Appears normal         Cord Vessels:           Appears normal (3
                        (orbits and profile)                           vessel cord)
 Lips:                  Appears normal         Kidneys:                Appear normal
 Palate:                Appears normal         Bladder:                Appears normal
 Thoracic:              Appears normal         Spine:                  Appears normal
 Heart:                 Appears normal         Upper Extremities:      Appears normal
                        (4CH, axis, and
                        situs)
 RVOT:                  Appears normal         Lower Extremities:      Appears normal

 Other:  Parents do not wish to know sex of fetus. Nasal bone visualized.
         Heels/feet and open hands/5th digits visualized. VC, 3VV and 3VTV
         visualized.
Cervix Uterus Adnexa

 Cervix
 Length:           3.99  cm.
 Normal appearance by transabdominal scan.

 Right Ovary
 Within normal limits.

 Left Ovary
 Within normal limits.
Comments

 This patient was seen for a detailed fetal anatomy scan due
 to a history of chronic hypertension currently treated with
 labetalol 100 mg twice a day.  The patient presented late for
 prenatal care.
 She denies any other significant past medical history and
 denies any problems in her current pregnancy.
 She has not had any screening tests for fetal aneuploidy
 drawn in her current pregnancy.
 She was informed that the fetal growth and amniotic fluid
 level were appropriate for her gestational age.
 There were no obvious fetal anomalies noted on today's
 ultrasound exam.
 The patient was informed that anomalies may be missed due
 to technical limitations. If the fetus is in a suboptimal position
 or maternal habitus is increased, visualization of the fetus in
 the maternal uterus may be impaired.
 The implications and management of chronic hypertension in
 pregnancy was discussed. The patient was advised that
 should her blood pressures continue to be elevated, the
 dosage of her antihypertensive medications may need to be
 increased.  The increased risk of superimposed
 preeclampsia, an indicated preterm delivery, and possible
 fetal growth restriction due to chronic hypertension in
 pregnancy was discussed. The patient was advised that we
 will continue to follow her closely throughout her pregnancy.
 We will continue to follow her with monthly growth scans.
 Weekly fetal testing should be started at around 32 weeks.
 To decrease her risk of superimposed preeclampsia, she
 should start taking a daily baby aspirin (81 mg daily) for
 preeclampsia prophylaxis.  She should also have a screening
 test drawn for detection of fetal aneuploidy at her next
 prenatal visit.
 A follow-up exam was scheduled in 4 weeks to assess the
 fetal growth.

## 2022-10-02 IMAGING — US US MFM FETAL BPP W/O NON-STRESS
1 series · 14 of 28 positions shown · non-contrast
Comparison: none

[Series 1: us mfm fetal bpp w/o non-stress · 29 acquisitions, 14 frames shown]
[im 2/29]
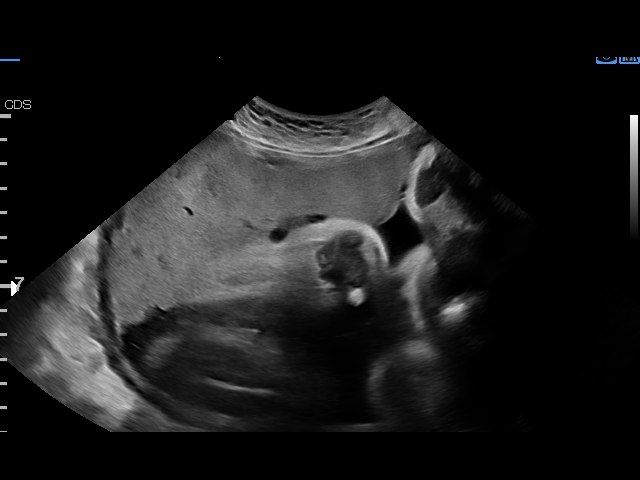
[im 4/29]
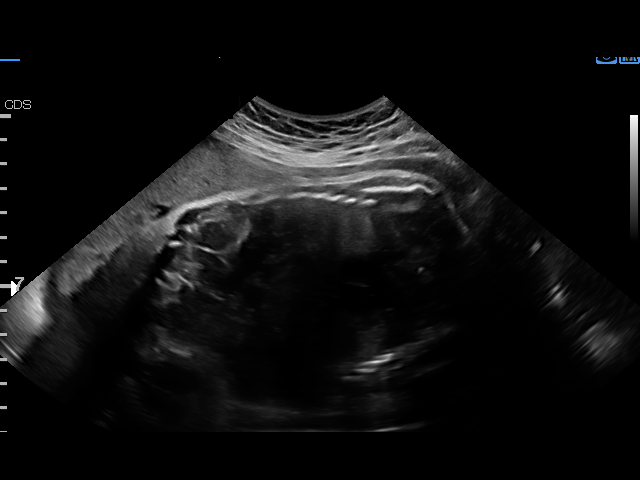
[im 6/29]
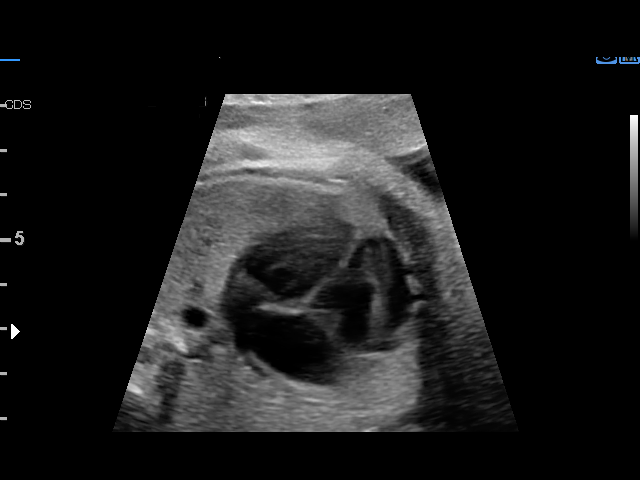
[im 8/29]
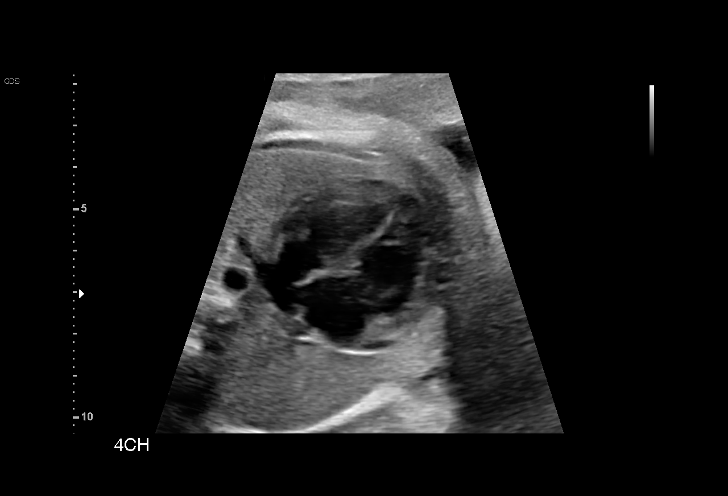
[im 10/29]
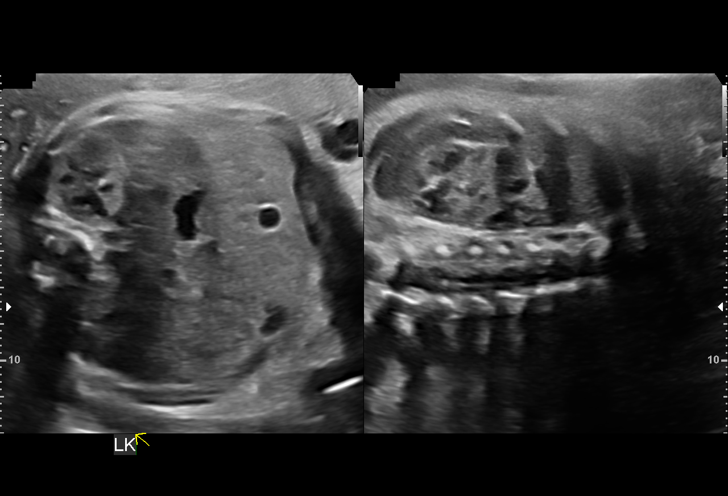
[im 12/29]
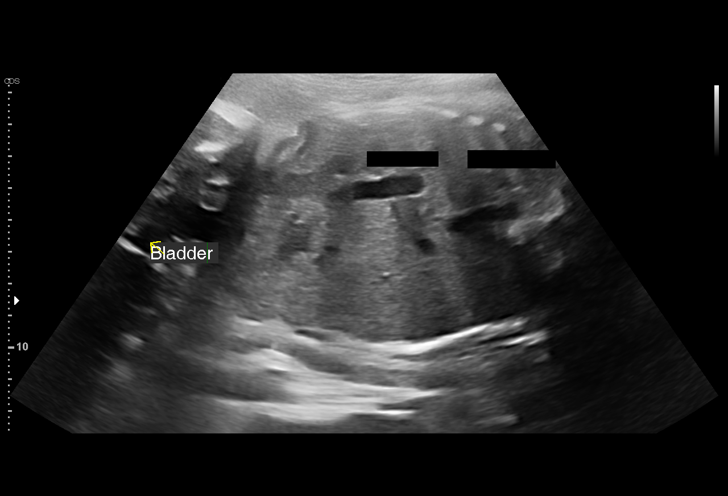
[im 14/29]
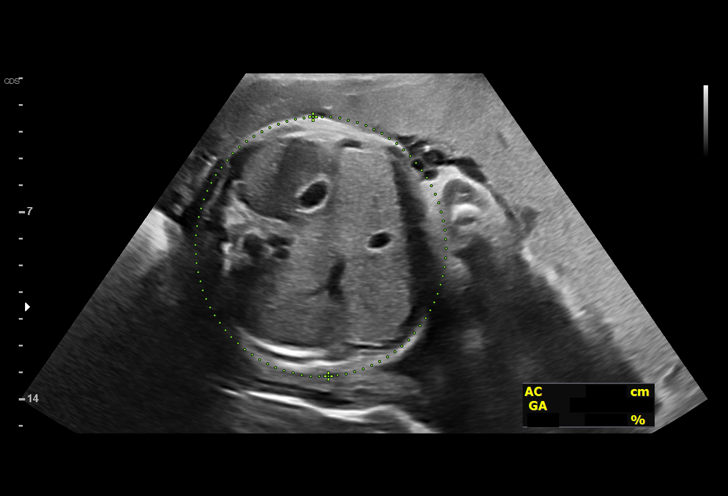
[im 16/29]
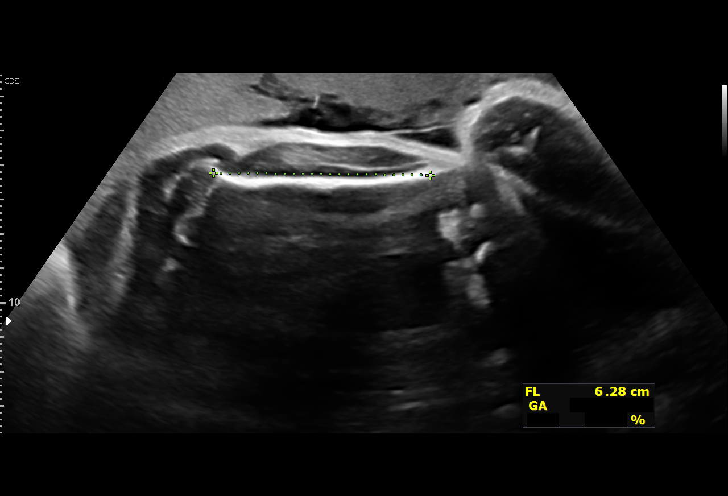
[im 18/29]
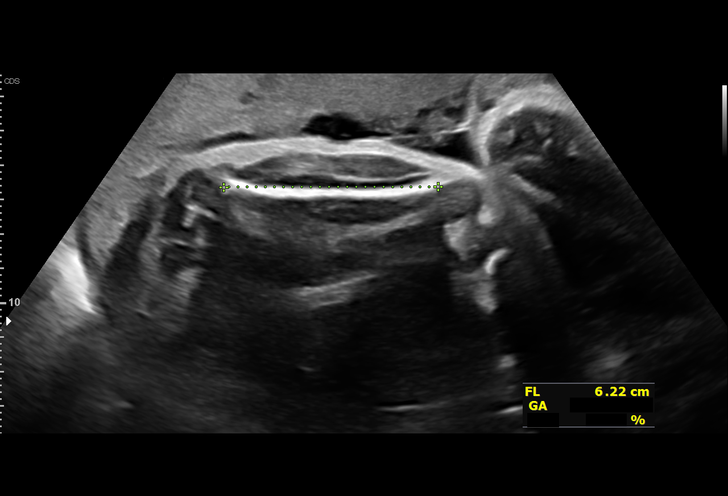
[im 20/29]
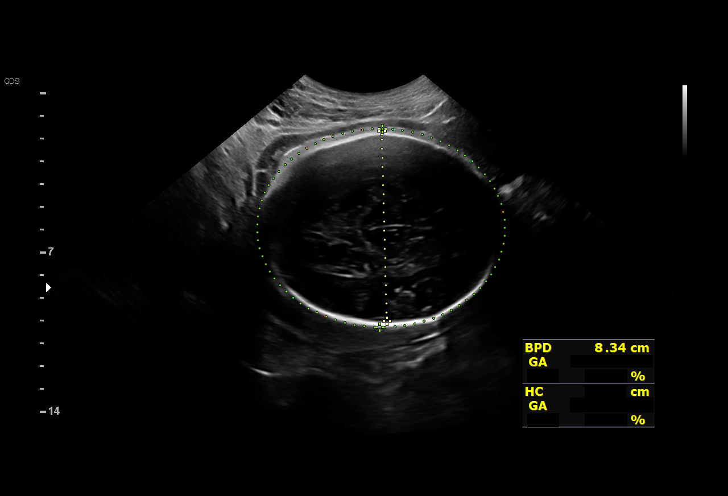
[im 22/29]
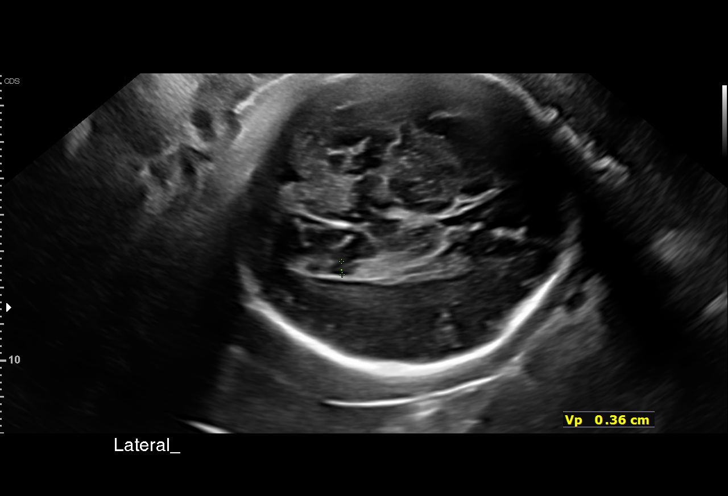
[im 24/29]
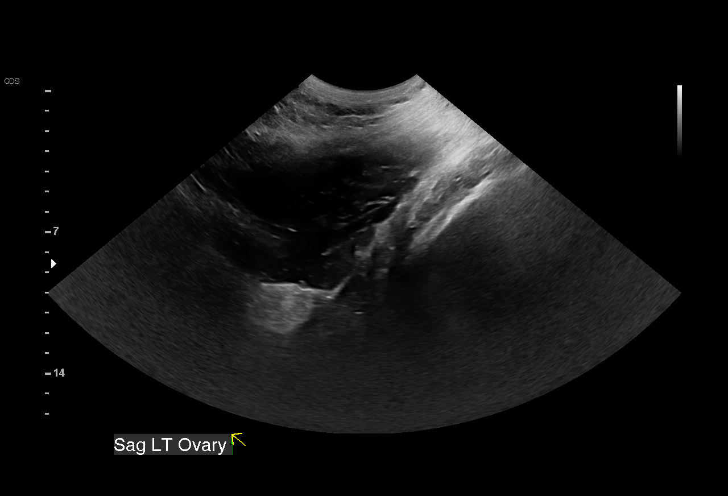
[im 26/29]
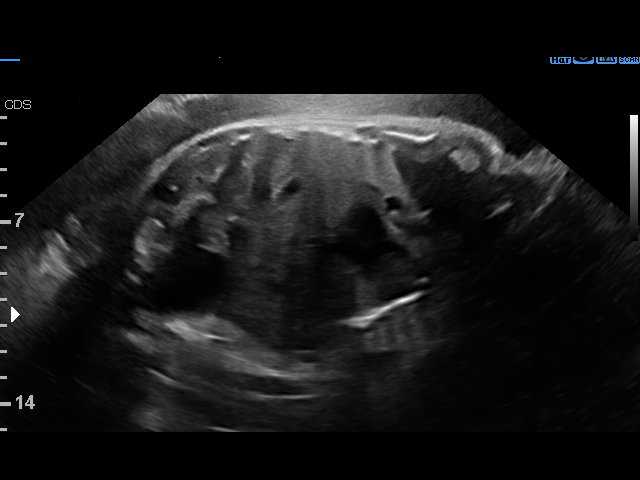
[im 29/29]
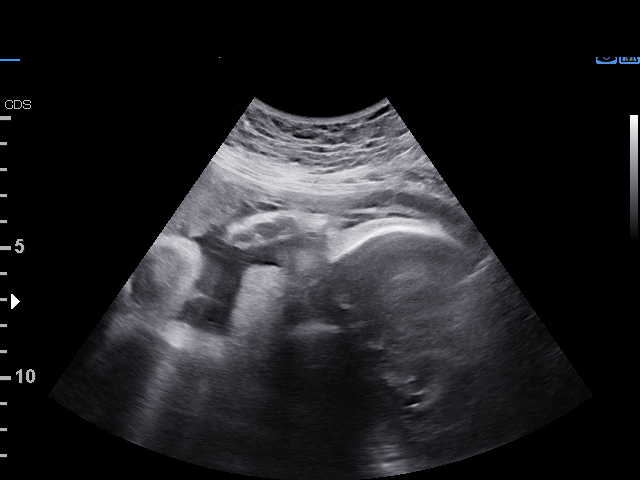

[14 of 28 positions shown; findings below may reference images not displayed]

Indications

 Hypertension - Chronic/Pre-existing
 33 weeks gestation of pregnancy
 Late prenatal care, second trimester
 Pelvic pain affecting pregnancy in second
 trimester
Fetal Evaluation

 Num Of Fetuses:         1
 Fetal Heart Rate(bpm):  143
 Cardiac Activity:       Observed
 Presentation:           Cephalic
 Placenta:               Anterior Fundal

 Amniotic Fluid
 AFI FV:      Within normal limits

 AFI Sum(cm)     %Tile       Largest Pocket(cm)
 11.5            29

 RUQ(cm)       RLQ(cm)       LUQ(cm)        LLQ(cm)
 4.1           2.2           3.2            2
Biophysical Evaluation

 Amniotic F.V:   Pocket => 2 cm             F. Tone:        Observed
 F. Movement:    Observed                   Score:          [DATE]
 F. Breathing:   Observed
Biometry

 BPD:      82.7  mm     G. Age:  33w 2d         40  %    CI:        72.19   %    70 - 86
                                                         FL/HC:      19.9   %    19.9 -
 HC:      309.7  mm     G. Age:  34w 4d         43  %    HC/AC:      1.03        0.96 -
 AC:      299.6  mm     G. Age:  34w 0d         67  %    FL/BPD:     74.5   %    71 - 87
 FL:       61.6  mm     G. Age:  32w 0d          9  %    FL/AC:      20.6   %    20 - 24
 LV:        3.6  mm

 Est. FW:    4252  gm    4 lb 13 oz      40  %
Gestational Age

 LMP:           33w 3d        Date:  12/13/19                 EDD:   09/18/20
 U/S Today:     33w 3d                                        EDD:   09/18/20
 Best:          33w 3d     Det. By:  LMP  (12/13/19)          EDD:   09/18/20
Anatomy

 Cranium:               Appears normal         LVOT:                   Previously seen
 Cavum:                 Previously seen        Aortic Arch:            Previously seen
 Ventricles:            Appears normal         Ductal Arch:            Previously seen
 Choroid Plexus:        Previously seen        Diaphragm:              Appears normal
 Cerebellum:            Previously seen        Stomach:                Appears normal, left
                                                                       sided
 Posterior Fossa:       Previously seen        Abdomen:                Appears normal
 Nuchal Fold:           Not applicable (>20    Abdominal Wall:         Previously seen
                        wks GA)
 Face:                  Orbits and profile     Cord Vessels:           Previously seen
                        previously seen
 Lips:                  Previously seen        Kidneys:                Appear normal
 Palate:                Previously seen        Bladder:                Appears normal
 Thoracic:              Appears normal         Spine:                  Previously seen
 Heart:                 Appears normal         Upper Extremities:      Previously seen
                        (4CH, axis, and
                        situs)
 RVOT:                  Previously seen        Lower Extremities:      Previously seen

 Other:  Parents do not wish to know sex of fetus. Nasal bone previously
         visualized. Heels/feet and open hands/5th digits previously visualized.
         VC, 3VV and 3VTV previously visualized.
Cervix Uterus Adnexa

 Cervix
 Not visualized (advanced GA >11wks)

 Uterus
 No abnormality visualized.

 Right Ovary
 Within normal limits.

 Left Ovary
 Within normal limits.
 Cul De Sac
 No free fluid seen.

 Adnexa
 No abnormality visualized.
Comments

 This patient was seen for a follow up growth scan due to
 chronic hypertension currently treated with labetalol.  She
 denies any problems since her last exam.  The patient's
 blood pressures in our office today were 141/85 and 149/81.
 She was informed that the fetal growth and amniotic fluid
 level appears appropriate for her gestational age.
 A biophysical profile performed today was [DATE].
 Preeclampsia precautions were reviewed today.
 She will return for another biophysical profile in 1 week.

## 2022-11-06 IMAGING — US US FETAL BPP W/ NON-STRESS
1 series · 14 of 14 positions shown · non-contrast
Comparison: none

[Series 1: us fetal bpp w/ non-stress · 14 acquisitions, 14 frames shown]
[im 1/14]
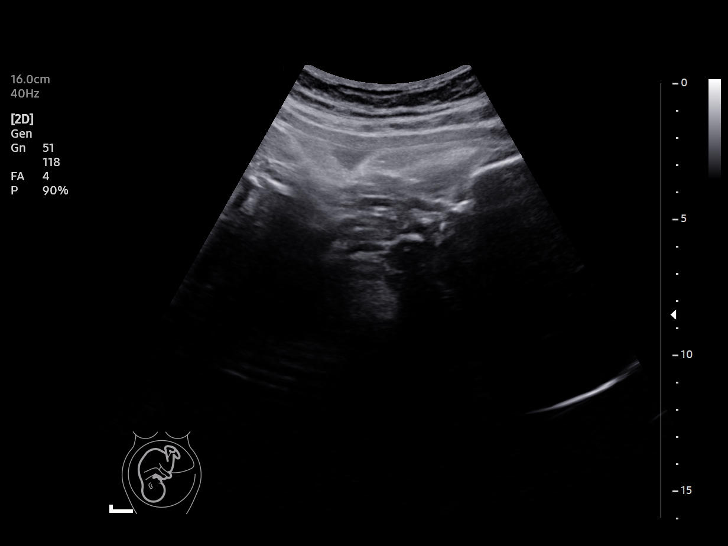
[im 2/14]
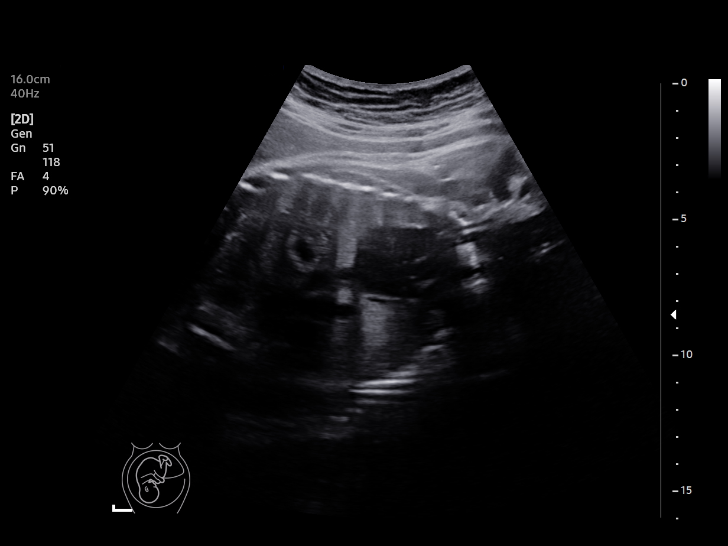
[im 3/14]
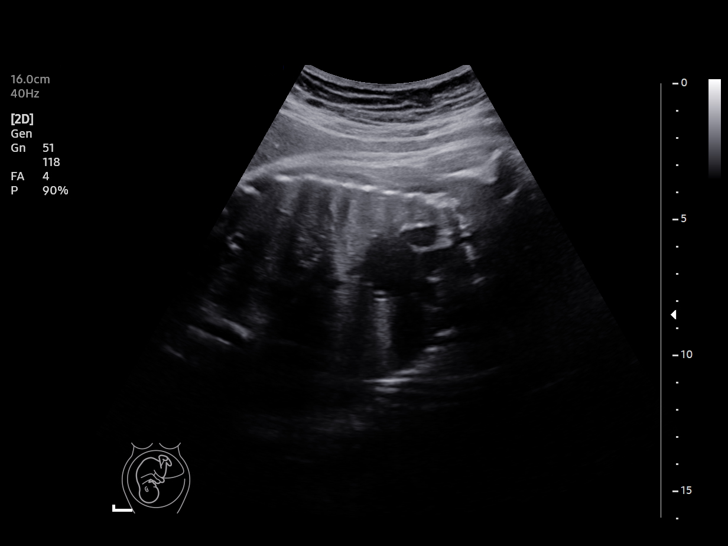
[im 4/14]
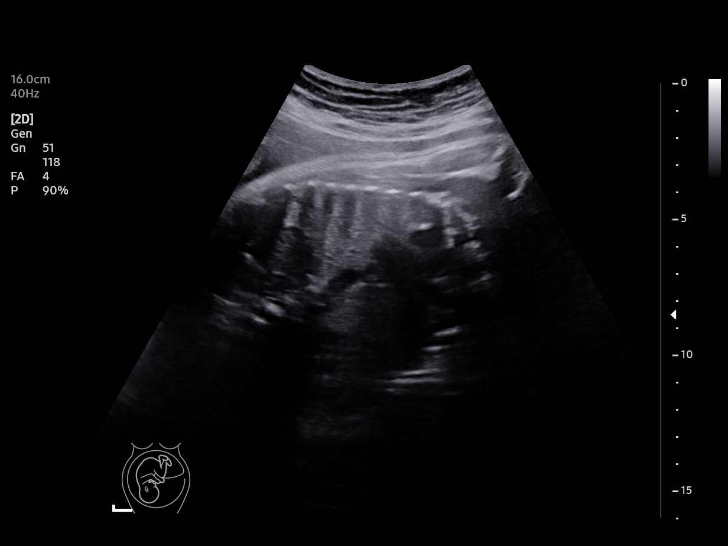
[im 5/14]
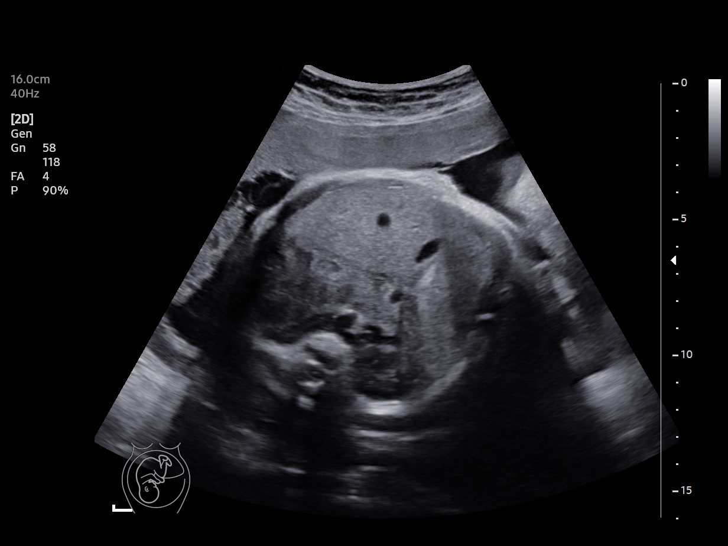
[im 6/14]
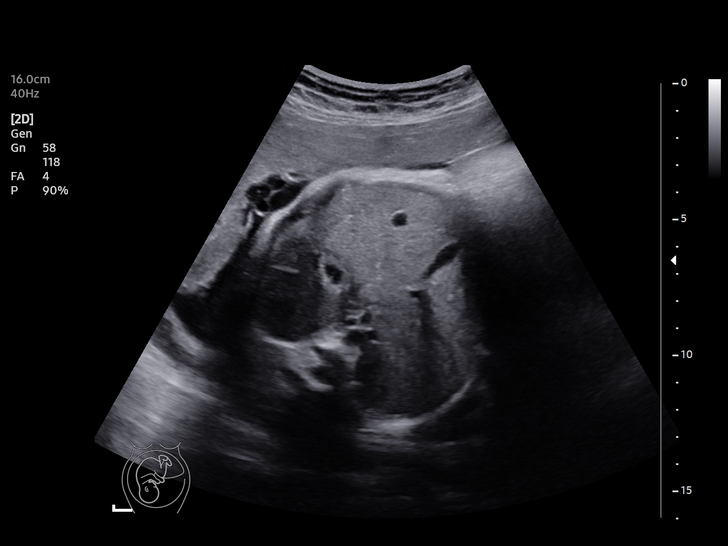
[im 7/14]
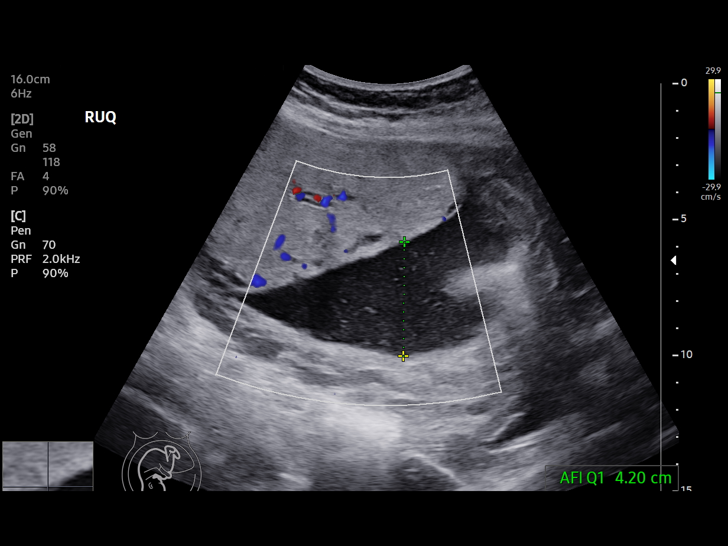
[im 8/14]
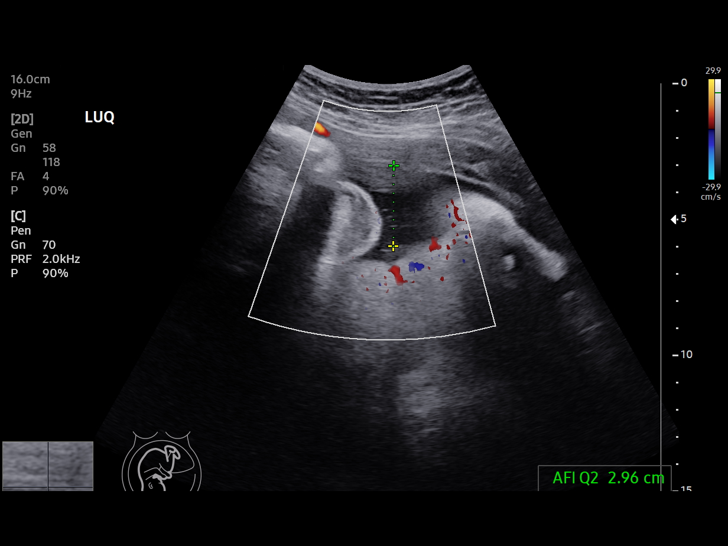
[im 9/14]
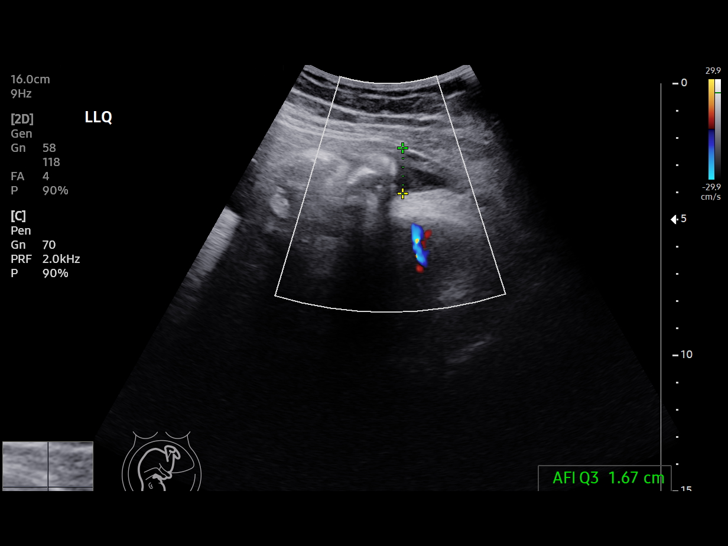
[im 10/14]
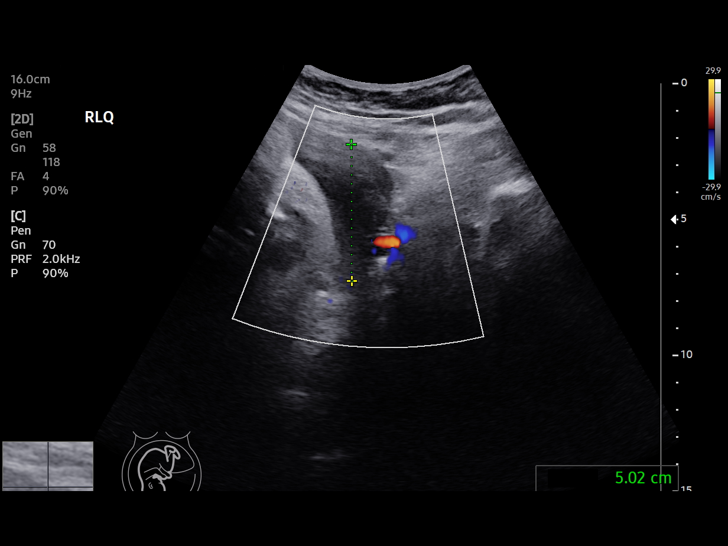
[im 11/14]
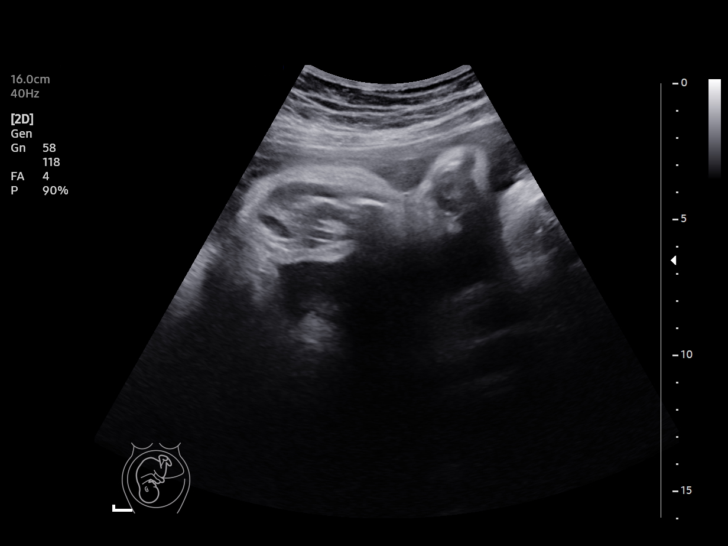
[im 12/14]
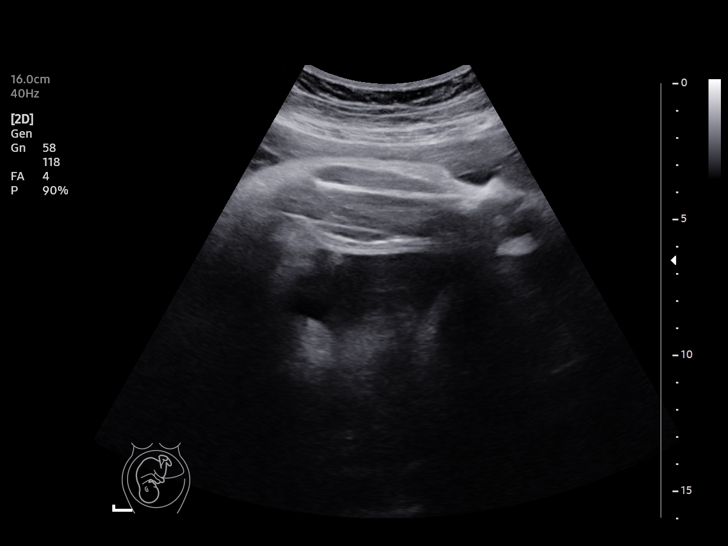
[im 13/14]
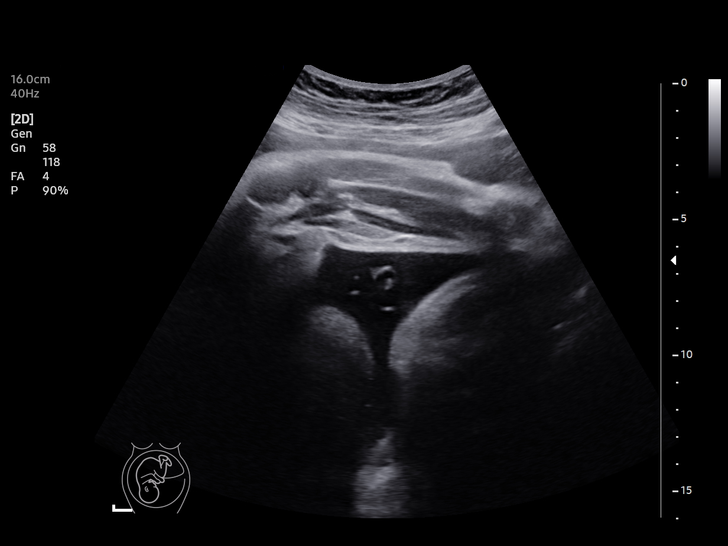
[im 14/14]
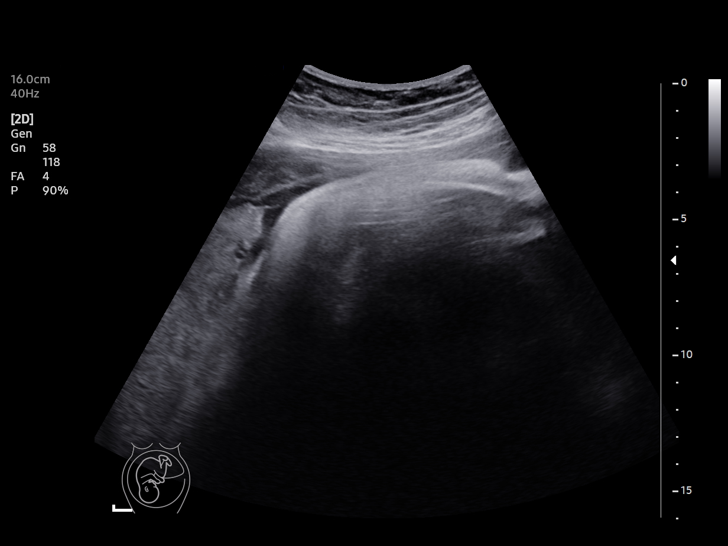

[14 of 14 positions shown; findings below may reference images not displayed]

[REDACTED]care at

 1  US FETAL BPP W/NONSTRESS              76818.4     NAZARETH JUMPER

Service(s) Provided

Indications

 38 weeks gestation of pregnancy
 Hypertension - Chronic/Pre-existing
Fetal Evaluation

 Num Of Fetuses:         1
 Preg. Location:         Intrauterine
 Cardiac Activity:       Observed
 Presentation:           Cephalic

 Amniotic Fluid
 AFI FV:      Within normal limits

 AFI Sum(cm)     %Tile       Largest Pocket(cm)
 13.9            54          5

 RUQ(cm)       RLQ(cm)       LUQ(cm)        LLQ(cm)
 4.2           5             3
Biophysical Evaluation

 Amniotic F.V:   Pocket => 2 cm             F. Tone:        Observed
 F. Movement:    Observed                   N.S.T:          Reactive
 F. Breathing:   Observed                   Score:          [DATE]
OB History

 Gravidity:    3         Term:   1         SAB:   1
 Living:       1
Gestational Age

 LMP:           38w 3d        Date:  12/13/19                 EDD:   09/18/20
 Best:          38w 3d     Det. By:  LMP  (12/13/19)          EDD:   09/18/20
Impression

 Reactive NST, BPP [DATE]
Recommendations

 Continue weekly antenatal testing till delivery .

## 2023-07-11 ENCOUNTER — Ambulatory Visit (HOSPITAL_COMMUNITY): Payer: Medicaid Other

## 2024-08-05 ENCOUNTER — Ambulatory Visit

## 2024-08-12 ENCOUNTER — Encounter (HOSPITAL_COMMUNITY): Payer: Self-pay

## 2024-08-12 ENCOUNTER — Ambulatory Visit
Admission: EM | Admit: 2024-08-12 | Discharge: 2024-08-12 | Disposition: A | Discharge status: Home / Self Care | Payer: Self-pay | Attending: Emergency Medicine | Admitting: Emergency Medicine

## 2024-08-12 DIAGNOSIS — N898 Other specified noninflammatory disorders of vagina: Secondary | ICD-10-CM | POA: Insufficient documentation

## 2024-08-12 HISTORY — DX: Other specified health status: Z78.9

## 2024-08-12 NOTE — Discharge Instructions (Addendum)
 As we discussed, your testing will be back in the next 1 to 2 days.  If you test positive for any infection you will be contacted by phone and treatment options will be provided.  If your results are negative they will appear in your MyChart.  If your swab comes back negative for any form of infection I would recommend following up with your OB/GYN.

## 2024-08-12 NOTE — ED Provider Notes (Signed)
 [facility]    CSN: [id number] Arrival date & time: 08/12/24  1137      History   Chief Complaint Chief Complaint  Patient presents with   Vaginal Itching   Vaginal Discharge    HPI Brittany Rasmussen is a 23 y.o. female.   HPI  23 year old female with no significant past medical history presents for evaluation of vaginal itching and a white clumpy discharge that has been going on for the last week.  She denies any odor or urinary symptoms.  She is sexually active but has not had intercourse in a month and a half.  She thinks her symptoms may be due to a honey pot that her mother-in-law gave her.  Past Medical History:  Diagnosis Date   No pertinent past medical history     There are no active problems to display for this patient.   Past Surgical History:  Procedure Laterality Date   NO PAST SURGERIES      OB History   No obstetric history on file.      Home Medications    Prior to Admission medications   Not on File    Family History History reviewed. No pertinent family history.  Social History Social History   Tobacco Use   Smoking status: Never   Smokeless tobacco: Never  Vaping Use   Vaping status: Some Days  Substance Use Topics   Alcohol use: Never   Drug use: Never     Allergies   Patient has no known allergies.   Review of Systems Review of Systems  Constitutional:  Negative for fever.  Genitourinary:  Positive for vaginal discharge and vaginal pain. Negative for dysuria, frequency and urgency.     Physical Exam Triage Vital Signs ED Triage Vitals  Encounter Vitals Group     BP      Girls Systolic BP Percentile      Girls Diastolic BP Percentile      Boys Systolic BP Percentile      Boys Diastolic BP Percentile      Pulse      Resp      Temp      Temp src      SpO2      Weight      Height      Head Circumference      Peak Flow      Pain Score      Pain Loc      Pain Education      Exclude from Growth  Chart    No data found.  Updated Vital Signs BP (!) 144/92 (BP Location: Left Arm)   Pulse 72   Temp 98.9 F (37.2 C) (Oral)   Resp 16   Wt 178 lb 6.4 oz (80.9 kg)   LMP 07/29/2024 (Approximate)   SpO2 100%   Visual Acuity Right Eye Distance:   Left Eye Distance:   Bilateral Distance:    Right Eye Near:   Left Eye Near:    Bilateral Near:     Physical Exam Vitals and nursing note reviewed.  Constitutional:      Appearance: Normal appearance. She is not ill-appearing.  HENT:     Head: Normocephalic and atraumatic.  Skin:    General: Skin is warm and dry.     Capillary Refill: Capillary refill takes less than 2 seconds.     Findings: No rash.  Neurological:     General: No focal deficit present.  Mental Status: She is alert and oriented to person, place, and time.      UC Treatments / Results  Labs (all labs ordered are listed, but only abnormal results are displayed) Labs Reviewed  CERVICOVAGINAL ANCILLARY ONLY    EKG   Radiology No results found.  Procedures Procedures (including critical care time)  Medications Ordered in UC Medications - No data to display  Initial Impression / Assessment and Plan / UC Course  I have reviewed the triage vital signs and the nursing notes.  Pertinent labs & imaging results that were available during my care of the patient were reviewed by me and considered in my medical decision making (see chart for details).   Patient is a nontoxic appearing 23 year old female presenting for evaluation of white clumpy vaginal discharge and vaginal itching that has been going on for last week.  She denies any recent antibiotic use or recent sexual intercourse.  She thinks her symptoms may be related to a honey pot vaginal suppository that a family member gave her to adjust her vaginal pH.  She states she is not concerned about STIs though she would like to be tested so I will order a vaginal cytology swab to assess for gonorrhea,  chlamydia, trichomonas, bacterial vaginosis, and a vaginal yeast infection.  I advised the patient that the swab will be back in next 24 to 48 hours and if she test positive for any form of infection should be contacted by phone or treatment options will be provided.  If her results are negative they will appear in her MyChart.   Final Clinical Impressions(s) / UC Diagnoses   Final diagnoses:  Vaginal discharge     Discharge Instructions      As we discussed, your testing will be back in the next 1 to 2 days.  If you test positive for any infection you will be contacted by phone and treatment options will be provided.  If your results are negative they will appear in your MyChart.  If your swab comes back negative for any form of infection I would recommend following up with your OB/GYN.     ED Prescriptions   None    PDMP not reviewed this encounter.   Bernardino Ditch, NP 08/12/24 1228

## 2024-08-12 NOTE — ED Triage Notes (Signed)
 Pt c/o vaginal itching & discharge x1 wk. No OTC meds. Denies any concerns for STD.

## 2024-08-13 LAB — CERVICOVAGINAL ANCILLARY ONLY
Bacterial Vaginitis (gardnerella): POSITIVE — AB
Candida Glabrata: NEGATIVE
Candida Vaginitis: POSITIVE — AB
Chlamydia: NEGATIVE
Comment: NEGATIVE
Comment: NEGATIVE
Comment: NEGATIVE
Comment: NEGATIVE
Comment: NEGATIVE
Comment: NORMAL
Neisseria Gonorrhea: NEGATIVE
Trichomonas: NEGATIVE

## 2024-08-14 ENCOUNTER — Ambulatory Visit (HOSPITAL_COMMUNITY): Payer: Self-pay

## 2024-08-14 MED ORDER — FLUCONAZOLE 150 MG PO TABS: 150.0000 mg | ORAL_TABLET | Freq: Once | ORAL | 0 refills | Status: DC

## 2024-08-14 MED ORDER — METRONIDAZOLE 500 MG PO TABS: 500.0000 mg | ORAL_TABLET | Freq: Two times a day (BID) | ORAL | 0 refills | Status: DC

## 2024-08-18 MED ORDER — METRONIDAZOLE 500 MG PO TABS: 500.0000 mg | ORAL_TABLET | Freq: Two times a day (BID) | ORAL | 0 refills | Status: AC

## 2024-08-18 MED ORDER — FLUCONAZOLE 150 MG PO TABS: 150.0000 mg | ORAL_TABLET | Freq: Once | ORAL | 0 refills | Status: AC
